# Patient Record
Sex: Male | Born: 1960
Health system: Southern US, Community
[De-identification: ages and names within clinical notes are randomized; demographics above are authoritative.]

## PROBLEM LIST (undated history)

## (undated) DIAGNOSIS — I2510 Atherosclerotic heart disease of native coronary artery without angina pectoris: Secondary | ICD-10-CM

## (undated) DIAGNOSIS — K589 Irritable bowel syndrome without diarrhea: Secondary | ICD-10-CM

---

## 2000-01-13 ENCOUNTER — Ambulatory Visit (HOSPITAL_COMMUNITY): Admission: RE | Admit: 2000-01-13 | Discharge: 2000-01-13 | Payer: Self-pay | Admitting: Urology

## 2000-01-13 ENCOUNTER — Encounter: Payer: Self-pay | Admitting: Urology

## 2016-08-02 DIAGNOSIS — Z1211 Encounter for screening for malignant neoplasm of colon: Secondary | ICD-10-CM | POA: Diagnosis not present

## 2016-08-02 DIAGNOSIS — Z125 Encounter for screening for malignant neoplasm of prostate: Secondary | ICD-10-CM | POA: Diagnosis not present

## 2016-08-02 DIAGNOSIS — Z0000 Encounter for general adult medical examination without abnormal findings: Secondary | ICD-10-CM | POA: Diagnosis not present

## 2016-08-02 DIAGNOSIS — E7800 Pure hypercholesterolemia, unspecified: Secondary | ICD-10-CM | POA: Diagnosis not present

## 2016-11-30 DIAGNOSIS — D489 Neoplasm of uncertain behavior, unspecified: Secondary | ICD-10-CM | POA: Diagnosis not present

## 2017-01-23 DIAGNOSIS — R109 Unspecified abdominal pain: Secondary | ICD-10-CM | POA: Diagnosis not present

## 2017-01-23 DIAGNOSIS — Z1211 Encounter for screening for malignant neoplasm of colon: Secondary | ICD-10-CM | POA: Diagnosis not present

## 2017-02-16 DIAGNOSIS — Z1211 Encounter for screening for malignant neoplasm of colon: Secondary | ICD-10-CM | POA: Diagnosis not present

## 2017-04-14 DIAGNOSIS — K5730 Diverticulosis of large intestine without perforation or abscess without bleeding: Secondary | ICD-10-CM | POA: Diagnosis not present

## 2017-04-14 DIAGNOSIS — Z1211 Encounter for screening for malignant neoplasm of colon: Secondary | ICD-10-CM | POA: Diagnosis not present

## 2017-04-14 DIAGNOSIS — K635 Polyp of colon: Secondary | ICD-10-CM | POA: Diagnosis not present

## 2018-12-26 ENCOUNTER — Emergency Department (HOSPITAL_COMMUNITY)
Admission: EM | Admit: 2018-12-26 | Discharge: 2018-12-26 | Disposition: A | Discharge status: Home / Self Care | Payer: Commercial Managed Care - PPO | Attending: Emergency Medicine | Admitting: Emergency Medicine

## 2018-12-26 ENCOUNTER — Emergency Department (HOSPITAL_COMMUNITY): Payer: Commercial Managed Care - PPO

## 2018-12-26 ENCOUNTER — Other Ambulatory Visit: Payer: Self-pay

## 2018-12-26 ENCOUNTER — Encounter (HOSPITAL_COMMUNITY): Payer: Self-pay | Admitting: Emergency Medicine

## 2018-12-26 DIAGNOSIS — N50812 Left testicular pain: Secondary | ICD-10-CM | POA: Diagnosis present

## 2018-12-26 DIAGNOSIS — R1032 Left lower quadrant pain: Secondary | ICD-10-CM | POA: Diagnosis not present

## 2018-12-26 DIAGNOSIS — R112 Nausea with vomiting, unspecified: Secondary | ICD-10-CM | POA: Insufficient documentation

## 2018-12-26 DIAGNOSIS — R8290 Unspecified abnormal findings in urine: Secondary | ICD-10-CM | POA: Insufficient documentation

## 2018-12-26 DIAGNOSIS — N23 Unspecified renal colic: Secondary | ICD-10-CM

## 2018-12-26 HISTORY — DX: Irritable bowel syndrome, unspecified: K58.9

## 2018-12-26 LAB — URINALYSIS, ROUTINE W REFLEX MICROSCOPIC
Bilirubin Urine: NEGATIVE
Glucose, UA: NEGATIVE mg/dL
Ketones, ur: NEGATIVE mg/dL
Leukocytes,Ua: NEGATIVE
Nitrite: NEGATIVE
Protein, ur: NEGATIVE mg/dL
RBC / HPF: >50 RBC/hpf — ABNORMAL HIGH (ref 0–5)
Specific Gravity, Urine: 1.004 — ABNORMAL LOW (ref 1.005–1.030)
pH: 6 (ref 5.0–8.0)

## 2018-12-26 LAB — COMPREHENSIVE METABOLIC PANEL
ALT: 45 U/L — ABNORMAL HIGH (ref 0–44)
AST: 40 U/L (ref 15–41)
Albumin: 4.2 g/dL (ref 3.5–5.0)
Alkaline Phosphatase: 84 U/L (ref 38–126)
Anion gap: 8 (ref 5–15)
BUN: 11 mg/dL (ref 6–20)
CO2: 23 mmol/L (ref 22–32)
Calcium: 9.2 mg/dL (ref 8.9–10.3)
Chloride: 106 mmol/L (ref 98–111)
Creatinine, Ser: 0.88 mg/dL (ref 0.61–1.24)
GFR calc Af Amer: >60 mL/min (ref >60)
GFR calc non Af Amer: >60 mL/min (ref >60)
Glucose, Bld: 101 mg/dL — ABNORMAL HIGH (ref 70–99)
Potassium: 4.1 mmol/L (ref 3.5–5.1)
Sodium: 137 mmol/L (ref 135–145)
Total Bilirubin: 0.8 mg/dL (ref 0.3–1.2)
Total Protein: 7.8 g/dL (ref 6.5–8.1)

## 2018-12-26 LAB — CBC WITH DIFFERENTIAL/PLATELET
Abs Immature Granulocytes: 0.03 10*3/uL (ref 0.00–0.07)
Basophils Absolute: 0 10*3/uL (ref 0.0–0.1)
Basophils Relative: 0 %
Eosinophils Absolute: 0 10*3/uL (ref 0.0–0.5)
Eosinophils Relative: 0 %
HCT: 43.3 % (ref 39.0–52.0)
Hemoglobin: 14.9 g/dL (ref 13.0–17.0)
Immature Granulocytes: 0 %
Lymphocytes Relative: 10 %
Lymphs Abs: 1.1 10*3/uL (ref 0.7–4.0)
MCH: 32.9 pg (ref 26.0–34.0)
MCHC: 34.4 g/dL (ref 30.0–36.0)
MCV: 95.6 fL (ref 80.0–100.0)
Monocytes Absolute: 0.5 10*3/uL (ref 0.1–1.0)
Monocytes Relative: 4 %
Neutro Abs: 9.9 10*3/uL — ABNORMAL HIGH (ref 1.7–7.7)
Neutrophils Relative %: 86 %
Platelets: 168 10*3/uL (ref 150–400)
RBC: 4.53 MIL/uL (ref 4.22–5.81)
RDW: 12.8 % (ref 11.5–15.5)
WBC: 11.6 10*3/uL — ABNORMAL HIGH (ref 4.0–10.5)
nRBC: 0 % (ref 0.0–0.2)

## 2018-12-26 LAB — LIPASE, BLOOD: Lipase: 25 U/L (ref 11–51)

## 2018-12-26 MED ORDER — HYDROCODONE-ACETAMINOPHEN 5-325 MG PO TABS: 2.0000 | ORAL_TABLET | ORAL | 0 refills | Status: DC | PRN

## 2018-12-26 MED ORDER — KETOROLAC TROMETHAMINE 30 MG/ML IJ SOLN
30.0000 mg | Freq: Once | INTRAMUSCULAR | Status: AC
  Administered 2018-12-26: 30 mg via INTRAVENOUS
  Filled 2018-12-26: qty 1

## 2018-12-26 MED ORDER — IBUPROFEN 600 MG PO TABS: 600.0000 mg | ORAL_TABLET | Freq: Four times a day (QID) | ORAL | 0 refills | Status: DC | PRN

## 2018-12-26 MED ORDER — TAMSULOSIN HCL 0.4 MG PO CAPS: 0.4000 mg | ORAL_CAPSULE | Freq: Every day | ORAL | 0 refills | Status: DC

## 2018-12-26 NOTE — ED Triage Notes (Signed)
Per EMS, patient from home, c/o testicle pain radiating to LLQ x3 days worsening this morning. One reported episode of N/V this morning. States swollen left testicle.  18g L AC 544ml NS with EMS BP 144/98 RR 16 HR 90 O2 98% RA

## 2018-12-26 NOTE — ED Provider Notes (Signed)
[facility] DEPT Provider Note   CSN: [id number] Arrival date & time: 12/26/18  1138    History   Chief Complaint Chief Complaint  Patient presents with  . Groin Swelling    HPI Larry Ashley is a 58 y.o. male.     HPI Patient presents with 2 days of left testicular pain which radiates to his left lower abdomen.  States the pain is episodic and sharp.  Had a episode of pain this morning associated with nausea and several bouts of vomiting.  He denies any flank or back pain.  States currently the pain has significantly improved.  States that yesterday he did notice that his urine was much darker in color and he had the sensation that he needed to urinate and was not able to.  Denies any penile discharge.  Thinks the left testicle may be more swollen than the right.  Denies any known trauma.  Patient is sexually active only with his wife.  No diarrhea or constipation.  No blood in the stool. Past Medical History:  Diagnosis Date  . IBS (irritable bowel syndrome)     There are no active problems to display for this patient.   History reviewed. No pertinent surgical history.      Home Medications    Prior to Admission medications   Medication Sig Start Date End Date Taking? Authorizing Provider  HYDROcodone-acetaminophen (NORCO) 5-325 MG tablet Take 2 tablets by mouth every 4 (four) hours as needed. 12/26/18   Julianne Rice, MD  ibuprofen (ADVIL) 600 MG tablet Take 1 tablet (600 mg total) by mouth every 6 (six) hours as needed for moderate pain. 12/26/18   Julianne Rice, MD  tamsulosin (FLOMAX) 0.4 MG CAPS capsule Take 1 capsule (0.4 mg total) by mouth daily. 12/26/18   Julianne Rice, MD    Family History No family history on file.  Social History Social History   Tobacco Use  . Smoking status: Not on file  Substance Use Topics  . Alcohol use: Not on file  . Drug use: Not on file     Allergies   Patient has no known allergies.    Review of Systems Review of Systems  Constitutional: Negative for chills, fatigue and fever.  Respiratory: Negative for cough and shortness of breath.   Cardiovascular: Negative for chest pain.  Gastrointestinal: Positive for abdominal pain, nausea and vomiting. Negative for constipation and diarrhea.  Genitourinary: Positive for difficulty urinating, scrotal swelling and testicular pain. Negative for discharge, dysuria, flank pain, frequency and penile pain.  Musculoskeletal: Negative for back pain, myalgias and neck pain.  Skin: Negative for rash and wound.  Neurological: Negative for dizziness, weakness, light-headedness, numbness and headaches.  All other systems reviewed and are negative.    Physical Exam Updated Vital Signs BP 120/85   Pulse 83   Temp 97.7 F (36.5 C) (Oral)   Resp 17   SpO2 99%   Physical Exam Vitals signs and nursing note reviewed.  Constitutional:      General: He is not in acute distress.    Appearance: Normal appearance. He is well-developed. He is not ill-appearing.  HENT:     Head: Normocephalic and atraumatic.     Nose: Nose normal.     Mouth/Throat:     Mouth: Mucous membranes are moist.  Eyes:     Pupils: Pupils are equal, round, and reactive to light.  Neck:     Musculoskeletal: Normal range of motion and neck supple.  Cardiovascular:     Rate and Rhythm: Normal rate and regular rhythm.     Heart sounds: No murmur. No friction rub. No gallop.   Pulmonary:     Effort: Pulmonary effort is normal. No respiratory distress.     Breath sounds: Normal breath sounds. No stridor. No wheezing, rhonchi or rales.  Chest:     Chest wall: No tenderness.  Abdominal:     General: Bowel sounds are normal. There is no distension.     Palpations: Abdomen is soft. There is no mass.     Tenderness: There is no abdominal tenderness. There is no right CVA tenderness, left CVA tenderness, guarding or rebound.     Hernia: No hernia is present.   Genitourinary:    Penis: Normal.      Comments: Circumcised penis.  No penile discharge.  Cremasteric reflexes bilaterally intact.  No appreciated masses or hernias.  Mild left testicular tenderness to palpation without obvious swelling.  Testicles appear to be in a normal lie. Musculoskeletal: Normal range of motion.        General: No tenderness.  Skin:    General: Skin is warm and dry.     Capillary Refill: Capillary refill takes less than 2 seconds.     Findings: No erythema or rash.  Neurological:     Mental Status: He is alert and oriented to person, place, and time.  Psychiatric:        Mood and Affect: Mood normal.        Behavior: Behavior normal.      ED Treatments / Results  Labs (all labs ordered are listed, but only abnormal results are displayed) Labs Reviewed  CBC WITH DIFFERENTIAL/PLATELET - Abnormal; Notable for the following components:      Result Value   WBC 11.6 (*)    Neutro Abs 9.9 (*)    All other components within normal limits  COMPREHENSIVE METABOLIC PANEL - Abnormal; Notable for the following components:   Glucose, Bld 101 (*)    ALT 45 (*)    All other components within normal limits  URINALYSIS, ROUTINE W REFLEX MICROSCOPIC - Abnormal; Notable for the following components:   APPearance HAZY (*)    Specific Gravity, Urine 1.004 (*)    Hgb urine dipstick LARGE (*)    RBC / HPF >50 (*)    Bacteria, UA FEW (*)    All other components within normal limits  LIPASE, BLOOD    EKG None  Radiology Ct Renal Stone Study  Result Date: 12/26/2018 CLINICAL DATA:  Left lower quadrant/flank pain with nausea and vomiting EXAM: CT ABDOMEN AND PELVIS WITHOUT CONTRAST TECHNIQUE: Multidetector CT imaging of the abdomen and pelvis was performed following the standard protocol without oral or IV contrast. COMPARISON:  None. FINDINGS: Lower chest: There are foci of bibasilar atelectasis. On axial slice 10 series 6, there is a 3 mm nodular opacity in the right  middle lobe. There are foci of coronary artery calcification. Hepatobiliary: No focal liver lesions are evident on this noncontrast enhanced study. The gallbladder wall is not appreciably thickened. There is no biliary duct dilatation. Pancreas: There is no pancreatic mass or inflammatory focus. Spleen: No splenic lesions are evident. Adrenals/Urinary Tract: Adrenals bilaterally appear unremarkable. Left kidney appears subtly edematous. There is no evident renal mass on either side. There is mild hydronephrosis on the left. There is no appreciable hydronephrosis on the right. There is no intrarenal calculus on either side. There is a 3 x  2 mm calculus at the left ureterovesical junction. No other ureteral calculi are evident. Urinary bladder is midline. There is slight thickening along the posterior wall of the urinary bladder. The remainder of the urinary bladder wall appears unremarkable. Stomach/Bowel: There are scattered colonic diverticula, most notably in the sigmoid region. No diverticulitis. No appreciable bowel wall thickening. No bowel obstruction. Terminal ileum appears unremarkable. There is no evident free air or portal venous air. Vascular/Lymphatic: There is aortic atherosclerosis. There are foci of calcification in each common iliac artery as well. No aneurysm evident. There is no adenopathy in the abdomen or pelvis. Reproductive: Prostate and seminal vesicles are normal in size and contour. No evident pelvic mass. Small calcifications are noted in the upper left scrotal sac. Other: Appendix appears normal. No abscess or ascites is evident in the abdomen or pelvis. Musculoskeletal: There are no blastic or lytic bone lesions. There is no intramuscular or abdominal wall lesions. IMPRESSION: 1. 3 x 2 mm calculus at the left ureterovesical junction with mild hydronephrosis on the left. Left kidney is subtly edematous. 2. Slight thickening of the wall of the urinary bladder posteriorly. Question localized  inflammation. A well-defined mass is not seen in this area. This localized thickening may warrant urologic consultation with direct visualization, however. 3. Small calcifications in the upper left scrotum of uncertain etiology. Benign etiology suspected. 4. Scattered colonic diverticula without diverticulitis. No bowel obstruction. No abscess in the abdomen pelvis. Appendix appears normal. 5. 3 mm nodular opacity in right middle lobe. No follow-up needed if patient is low-risk. Non-contrast chest CT can be considered in 12 months if patient is high-risk. This recommendation follows the consensus statement: Guidelines for Management of Incidental Pulmonary Nodules Detected on CT Images: From the Fleischner Society 2017; Radiology 2017; 284:228-243. 6. Scattered foci of arterial vascular calcification including foci of coronary artery calcification. Electronically Signed   By: Lowella Grip III M.D.   On: 12/26/2018 13:55    Procedures Procedures (including critical care time)  Medications Ordered in ED Medications  ketorolac (TORADOL) 30 MG/ML injection 30 mg (30 mg Intravenous Given 12/26/18 1417)     Initial Impression / Assessment and Plan / ED Course  I have reviewed the triage vital signs and the nursing notes.  Pertinent labs & imaging results that were available during my care of the patient were reviewed by me and considered in my medical decision making (see chart for details).        Patient is comfortable appearing.  CT renal stone with evidence of ureteral stone on the left moderate hydronephrosis.  No evidence of infectious process.  Will treat symptomatically and have follow-up with urology.  Strict return precautions have been given.  Final Clinical Impressions(s) / ED Diagnoses   Final diagnoses:  Ureteral colic    ED Discharge Orders         Ordered    ibuprofen (ADVIL) 600 MG tablet  Every 6 hours PRN     12/26/18 1424    HYDROcodone-acetaminophen (NORCO) 5-325  MG tablet  Every 4 hours PRN     12/26/18 1424    tamsulosin (FLOMAX) 0.4 MG CAPS capsule  Daily     12/26/18 1424           Julianne Rice, MD 12/26/18 1425

## 2020-11-17 ENCOUNTER — Emergency Department (HOSPITAL_COMMUNITY): Payer: Commercial Managed Care - PPO

## 2020-11-17 ENCOUNTER — Emergency Department (HOSPITAL_COMMUNITY)
Admission: EM | Admit: 2020-11-17 | Discharge: 2020-11-17 | Disposition: A | Discharge status: Home / Self Care | Payer: Commercial Managed Care - PPO | Attending: Emergency Medicine | Admitting: Emergency Medicine

## 2020-11-17 ENCOUNTER — Other Ambulatory Visit: Payer: Self-pay

## 2020-11-17 ENCOUNTER — Encounter (HOSPITAL_COMMUNITY): Payer: Self-pay | Admitting: Emergency Medicine

## 2020-11-17 DIAGNOSIS — F17210 Nicotine dependence, cigarettes, uncomplicated: Secondary | ICD-10-CM | POA: Diagnosis not present

## 2020-11-17 DIAGNOSIS — R0602 Shortness of breath: Secondary | ICD-10-CM | POA: Insufficient documentation

## 2020-11-17 DIAGNOSIS — R0789 Other chest pain: Secondary | ICD-10-CM | POA: Diagnosis not present

## 2020-11-17 LAB — BASIC METABOLIC PANEL
Anion gap: 7 (ref 5–15)
BUN: 7 mg/dL (ref 6–20)
CO2: 23 mmol/L (ref 22–32)
Calcium: 9.2 mg/dL (ref 8.9–10.3)
Chloride: 107 mmol/L (ref 98–111)
Creatinine, Ser: 0.95 mg/dL (ref 0.61–1.24)
GFR, Estimated: >60 mL/min (ref >60)
Glucose, Bld: 171 mg/dL — ABNORMAL HIGH (ref 70–99)
Potassium: 3.5 mmol/L (ref 3.5–5.1)
Sodium: 137 mmol/L (ref 135–145)

## 2020-11-17 LAB — CBC
HCT: 44.8 % (ref 39.0–52.0)
Hemoglobin: 15.7 g/dL (ref 13.0–17.0)
MCH: 33.3 pg (ref 26.0–34.0)
MCHC: 35 g/dL (ref 30.0–36.0)
MCV: 95.1 fL (ref 80.0–100.0)
Platelets: 178 10*3/uL (ref 150–400)
RBC: 4.71 MIL/uL (ref 4.22–5.81)
RDW: 13.2 % (ref 11.5–15.5)
WBC: 7.1 10*3/uL (ref 4.0–10.5)
nRBC: 0 % (ref 0.0–0.2)

## 2020-11-17 LAB — D-DIMER, QUANTITATIVE: D-Dimer, Quant: 0.33 ug/mL-FEU (ref 0.00–0.50)

## 2020-11-17 LAB — TROPONIN I (HIGH SENSITIVITY)
Troponin I (High Sensitivity): 4 ng/L (ref <18)
Troponin I (High Sensitivity): 4 ng/L (ref <18)

## 2020-11-17 MED ORDER — ASPIRIN 81 MG PO CHEW
324.0000 mg | CHEWABLE_TABLET | Freq: Once | ORAL | Status: AC
  Administered 2020-11-17: 324 mg via ORAL
  Filled 2020-11-17: qty 4

## 2020-11-17 NOTE — ED Provider Notes (Signed)
Emergency Medicine Provider Triage Evaluation Note  Larry Ashley , a 60 y.o. male  was evaluated in triage.  Pt complains of chest pain and tightness onset at 0430 which has been constant. Radiates to L shoulder and associated with SOB. No medications taken PTA and no prior hx of ACS. Hx of CAD and ACS in father at the age of 61.  Review of Systems  Positive: Chest pain, SOB Negative: syncope  Physical Exam  There were no vitals taken for this visit. Gen:   Awake, alert Resp:  Dyspneic without tachypnea. Chest expansion symmetric. MSK:   Moves extremities without difficulty  Other:  Anxious appearing  Medical Decision Making  Medically screening exam initiated at 6:33 AM.  Appropriate orders placed.  VIRAAJ VORNDRAN was informed that the remainder of the evaluation will be completed by another provider, this initial triage assessment does not replace that evaluation, and the importance of remaining in the ED until their evaluation is complete.  Chest pain   Antonietta Breach, PA-C 11/17/20 1275    Fredia Sorrow, MD 11/17/20 1427

## 2020-11-17 NOTE — ED Provider Notes (Signed)
[facility] EMERGENCY DEPARTMENT Provider Note   CSN: [id number] Arrival date & time: 11/17/20  0539     History Chief Complaint  Patient presents with   Chest Pain    Larry Ashley is a 60 y.o. male.  Patient awoke this morning at 430 with some anterior chest tightness that has persisted and not feeling quite right and shortness of breath.  Patient states this is happened before sometimes lasting a day sometimes just hours but not has happened recently.  And this alarmed him more.  No nausea no vomiting.  No leg swelling.  Patient has not followed by cardiology.  No known cardiac history.      Past Medical History:  Diagnosis Date   IBS (irritable bowel syndrome)     There are no problems to display for this patient.   History reviewed. No pertinent surgical history.     No family history on file.  Social History   Tobacco Use   Smoking status: Every Day    Packs/day: 1.00    Years: 49.00    Pack years: 49.00    Types: Cigarettes   Smokeless tobacco: Never    Home Medications Prior to Admission medications   Medication Sig Start Date End Date Taking? Authorizing Provider  HYDROcodone-acetaminophen (NORCO) 5-325 MG tablet Take 2 tablets by mouth every 4 (four) hours as needed. 12/26/18   Julianne Rice, MD  ibuprofen (ADVIL) 600 MG tablet Take 1 tablet (600 mg total) by mouth every 6 (six) hours as needed for moderate pain. 12/26/18   Julianne Rice, MD  tamsulosin (FLOMAX) 0.4 MG CAPS capsule Take 1 capsule (0.4 mg total) by mouth daily. 12/26/18   Julianne Rice, MD    Allergies    Patient has no known allergies.  Review of Systems   Review of Systems  Constitutional:  Negative for chills and fever.  HENT:  Negative for ear pain and sore throat.   Eyes:  Negative for pain and visual disturbance.  Respiratory:  Positive for shortness of breath. Negative for cough.   Cardiovascular:  Positive for chest pain. Negative for  palpitations.  Gastrointestinal:  Negative for abdominal pain and vomiting.  Genitourinary:  Negative for dysuria and hematuria.  Musculoskeletal:  Negative for arthralgias and back pain.  Skin:  Negative for color change and rash.  Neurological:  Negative for seizures and syncope.  All other systems reviewed and are negative.  Physical Exam Updated Vital Signs BP (!) 149/91   Pulse 65   Temp 97.6 F (36.4 C) (Oral)   Resp 10   SpO2 98%   Physical Exam Vitals and nursing note reviewed.  Constitutional:      General: He is not in acute distress.    Appearance: He is well-developed.  HENT:     Head: Normocephalic and atraumatic.  Eyes:     Conjunctiva/sclera: Conjunctivae normal.  Cardiovascular:     Rate and Rhythm: Normal rate and regular rhythm.     Heart sounds: No murmur heard. Pulmonary:     Effort: Pulmonary effort is normal. No respiratory distress.     Breath sounds: Normal breath sounds.  Chest:     Chest wall: No tenderness.  Abdominal:     Palpations: Abdomen is soft.     Tenderness: There is no abdominal tenderness.  Musculoskeletal:        General: No swelling.     Cervical back: Neck supple.  Skin:    General: Skin is warm  and dry.  Neurological:     General: No focal deficit present.     Mental Status: He is alert and oriented to person, place, and time.    ED Results / Procedures / Treatments   Labs (all labs ordered are listed, but only abnormal results are displayed) Labs Reviewed  BASIC METABOLIC PANEL - Abnormal; Notable for the following components:      Result Value   Glucose, Bld 171 (*)    All other components within normal limits  CBC  D-DIMER, QUANTITATIVE  TROPONIN I (HIGH SENSITIVITY)  TROPONIN I (HIGH SENSITIVITY)    EKG EKG Interpretation  Date/Time:  Tuesday November 17 2020 06:44:02 EDT Ventricular Rate:  83 PR Interval:  132 QRS Duration: 84 QT Interval:  346 QTC Calculation: 406 R Axis:   54 Text  Interpretation: Normal sinus rhythm Low voltage QRS Cannot rule out Anterior infarct , age undetermined Abnormal ECG Confirmed by Fredia Sorrow [phone]) on 11/17/2020 2:26:51 PM  Radiology DG Chest 2 View  Result Date: 11/17/2020 CLINICAL DATA:  60 year old male with shortness of breath and chest pain. Left shoulder pain. Smoker. EXAM: CHEST - 2 VIEW COMPARISON:  CT Abdomen and Pelvis 12/26/2018. FINDINGS: Lung volumes and mediastinal contours are within normal limits. Bilateral increased pulmonary interstitial markings, similar to those at the lung bases in 2020. Probable bilateral nipple shadows. EKG button artifact. No pneumothorax, pulmonary edema, pleural effusion or confluent pulmonary opacity. Visualized tracheal air column is within normal limits. No acute osseous abnormality identified. Negative visible bowel gas pattern. IMPRESSION: Nipple shadows and probable smoking related increased pulmonary interstitial markings. No definite acute cardiopulmonary abnormality. Electronically Signed   By: Genevie Ann M.D.   On: 11/17/2020 07:17    Procedures Procedures   Medications Ordered in ED Medications  aspirin chewable tablet 324 mg (324 mg Oral Given 11/17/20 1337)    ED Course  I have reviewed the triage vital signs and the nursing notes.  Pertinent labs & imaging results that were available during my care of the patient were reviewed by me and considered in my medical decision making (see chart for details).    MDM Rules/Calculators/A&P                         Patient's chest pain is concerning for possible acute cardiac event.  EKG without any acute changes except for cannot rule out anterior infarct but nothing that looks acute.  Chest x-ray without any significant findings.  Troponins are negative x2 essentially ruling out an acute cardiac event.  However there is some concern because of the shortness of breath possible pulmonary embolus.  But that would not fit into his past history of  similar pain but this was worse.  Will get D-dimer.  If D-dimer is elevated patient will get CT scan chest.  If it is not then patient can be discharged home.  We will have patient follow-up with cardiology.  Final Clinical Impression(s) / ED Diagnoses Final diagnoses:  Atypical chest pain  SOB (shortness of breath)    Rx / DC Orders ED Discharge Orders     None        Fredia Sorrow, MD 11/17/20 1530

## 2020-11-17 NOTE — Discharge Instructions (Signed)
Work-up for the chest pain negative here today.  Make an appointment to follow-up with cardiology.  Turn for any new or worse symptoms

## 2020-11-17 NOTE — ED Triage Notes (Signed)
Patient states that he is unable to catch his breath and has a "weird" feeling in his chest.  Patient states that he is having some left shoulder pain with this feeling.  Patient denies any nausea or vomiting.  Patient does have anxiety.

## 2020-12-02 ENCOUNTER — Encounter: Payer: Self-pay | Admitting: Cardiovascular Disease

## 2020-12-02 ENCOUNTER — Other Ambulatory Visit: Payer: Self-pay

## 2020-12-02 ENCOUNTER — Ambulatory Visit: Payer: Commercial Managed Care - PPO | Admitting: Cardiovascular Disease

## 2020-12-02 VITALS — BP 134/78 | HR 76 | Ht 62.0 in | Wt 146.8 lb

## 2020-12-02 DIAGNOSIS — R0602 Shortness of breath: Secondary | ICD-10-CM

## 2020-12-02 DIAGNOSIS — E7800 Pure hypercholesterolemia, unspecified: Secondary | ICD-10-CM | POA: Diagnosis not present

## 2020-12-02 DIAGNOSIS — R079 Chest pain, unspecified: Secondary | ICD-10-CM

## 2020-12-02 DIAGNOSIS — R072 Precordial pain: Secondary | ICD-10-CM | POA: Diagnosis not present

## 2020-12-02 DIAGNOSIS — Z8249 Family history of ischemic heart disease and other diseases of the circulatory system: Secondary | ICD-10-CM

## 2020-12-02 DIAGNOSIS — Z720 Tobacco use: Secondary | ICD-10-CM | POA: Insufficient documentation

## 2020-12-02 MED ORDER — METOPROLOL TARTRATE 100 MG PO TABS: ORAL_TABLET | ORAL | 0 refills | Status: DC

## 2020-12-02 NOTE — Progress Notes (Signed)
12/02/2020 Larry Ashley   10-26-1960  RN:[id number]  Primary Physician Larry Ashley, L.Larry Sa, MD Primary Cardiologist: Larry Harp MD Larry Ashley, [place]  HPI:  Larry Ashley is a 60 y.o. 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 He was referred by the emergency room because of chest pain.  His risk factors include 75 pack years of tobacco abuse, strong family history of heart disease with both mother and father who have had CAD in both who are patients of mine.  He is never had a heart attack or stroke.  He is developed chest pain over the last 6 to 8 months occurring once or twice a week lasting up to 30 minutes at a time.  He was recently seen in the ER 11/17/2020 with chest pain.  He ruled out for myocardial infarction.  His EKG showed no acute changes.  No outpatient medications have been marked as taking for the 12/02/20 encounter (Office Visit) with Larry Harp, MD.     No Known Allergies  Social History   Socioeconomic History   Marital status: Married    Spouse name: Not on file   Number of children: Not on file   Years of education: Not on file   Highest education level: Not on file  Occupational History   Not on file  Tobacco Use   Smoking status: Every Day    Packs/day: 1.00    Years: 49.00    Pack years: 49.00    Types: Cigarettes   Smokeless tobacco: Never  Substance and Sexual Activity   Alcohol use: Not on file   Drug use: Not on file   Sexual activity: Not on file  Other Topics Concern   Not on file  Social History Narrative   Not on file   Social Determinants of Health   Financial Resource Strain: Not on file  Food Insecurity: Not on file  Transportation Needs: Not on file  Physical Activity: Not on file  Stress: Not on file  Social Connections: Not on file  Intimate Partner Violence: Not on file      Review of Systems: General: negative for chills, fever, night sweats or weight changes.  Cardiovascular: negative for chest pain, dyspnea on exertion, edema, orthopnea, palpitations, paroxysmal nocturnal dyspnea or shortness of breath Dermatological: negative for rash Respiratory: negative for cough or wheezing Urologic: negative for hematuria Abdominal: negative for nausea, vomiting, diarrhea, bright red blood per rectum, melena, or hematemesis Neurologic: negative for visual changes, syncope, or dizziness All other systems reviewed and are otherwise negative except as noted above.    Blood pressure 134/78, pulse 76, height '5\' 2"'$  (1.575 m), weight 146 lb 12.8 oz (66.6 kg), SpO2 99 %.  General appearance: alert and no distress Neck: no adenopathy, no carotid bruit, no JVD, supple, symmetrical, trachea midline, and thyroid not enlarged, symmetric, no tenderness/mass/nodules Lungs: clear to auscultation bilaterally Heart: regular rate and rhythm, S1, S2 normal, no murmur, click, rub or gallop Extremities: extremities normal, atraumatic, no cyanosis or edema Pulses: 2+ and symmetric Skin: Skin color, texture, turgor normal. No rashes or lesions Neurologic: Grossly normal  EKG not performed today  ASSESSMENT AND PLAN:   Tobacco abuse Smokes 1-1/2 packs a day for last 50 years (75 pack years) recalcitrant risk factor modification.  Family history of heart disease Both mother (Larry Ashley) and  father Larry Ashley ) are both patients of mine and both have CAD.)  Chest pain of uncertain etiology [id number] history of chest pain lasting up to 30 minutes at a time with radiation to his neck occurring once or twice a week.  He was recently seen in the emergency room 11/17/2020 with chest pain.  His enzymes were negative.  EKG shows no acute changes.  He was awakened from sleep with chest pain when he self went to the emergency room.     Larry Harp MD FACP,FACC,FAHA, [facility] 12/02/2020 9:10  AM

## 2020-12-02 NOTE — Assessment & Plan Note (Signed)
6-48-monthhistory of chest pain lasting up to 30 minutes at a time with radiation to his neck occurring once or twice a week.  He was recently seen in the emergency room 11/17/2020 with chest pain.  His enzymes were negative.  EKG shows no acute changes.  He was awakened from sleep with chest pain when he self went to the emergency room.

## 2020-12-02 NOTE — Patient Instructions (Signed)
Lab Work:  Your physician recommends that you return for lab work FASTING  If you have labs (blood work) drawn today and your tests are completely normal, you will receive your results only by: [place] (if you have [place]) OR A paper copy in the mail If you have any lab test that is abnormal or we need to change your treatment, we will call you to review the results.   Testing/Procedures:  Your physician has requested that you have an echocardiogram. Echocardiography is a painless test that uses sound waves to create images of your heart. It provides your doctor with information about the size and shape of your heart and how well your heart's chambers and valves are working. This procedure takes approximately one hour. There are no restrictions for this procedure. [place]    Your cardiac CT will be scheduled at one of the below locations:   [facility] [address] [place],  [address] [phone]   If scheduled at [facility], please arrive at the [facility] main entrance (entrance A) of [facility] 30 minutes prior to test start time. Proceed to the [facility] Radiology Department (first floor) to check-in and test prep.   Please follow these instructions carefully (unless otherwise directed):  Hold all erectile dysfunction medications at least 3 days (72 hrs) prior to test.  On the Night Before the Test: Be sure to Drink plenty of water. Do not consume any caffeinated/decaffeinated beverages or chocolate 12 hours prior to your test. Do not take any antihistamines 12 hours prior to your test.  On the Day of the Test: Drink plenty of water until 1 hour prior to the test. Do not eat any food 4 hours prior to the test. You may take your regular medications prior to the test.  Take metoprolol (Lopressor) 100 MG two hours prior to test. HOLD Furosemide/Hydrochlorothiazide morning of the test.   After the  Test: Drink plenty of water. After receiving IV contrast, you may experience a mild flushed feeling. This is normal. On occasion, you may experience a mild rash up to 24 hours after the test. This is not dangerous. If this occurs, you can take Benadryl 25 mg and increase your fluid intake. If you experience trouble breathing, this can be serious. If it is severe call 911 IMMEDIATELY. If it is mild, please call our office. If you take any of these medications: Glipizide/Metformin, Avandament, Glucavance, please do not take 48 hours after completing test unless otherwise instructed.  Please allow 2-4 weeks for scheduling of routine cardiac CTs. Some insurance companies require a pre-authorization which may delay scheduling of this test.   For non-scheduling related questions, please contact the cardiac imaging nurse navigator should you have any questions/concerns: Marchia Bond, Cardiac Imaging Nurse Navigator Gordy Clement, Cardiac Imaging Nurse Navigator [facility] Direct Office Dial: [phone]   For scheduling needs, including cancellations and rescheduling, please call [place], [phone].    Follow-Up: At [facility], you and your health needs are our priority.  As part of our continuing mission to provide you with exceptional heart care, we have created designated Provider Care Teams.  These Care Teams include your primary Cardiologist (physician) and Advanced Practice Providers (APPs -  Physician Assistants and Nurse Practitioners) who all work together to provide you with the care you need, when you need it.  We recommend signing up for the patient portal called "MyChart".  Sign up  information is provided on this After Visit Summary.  MyChart is used to connect with patients for Virtual Visits (Telemedicine).  Patients are able to view lab/test results, encounter notes, upcoming appointments, etc.  Non-urgent messages can be sent to your provider as  well.   To learn more about what you can do with MyChart, go to [email or web address].    Your next appointment:   3 week(s)  The format for your next appointment:   In Person  Provider:   Quay Burow, MD

## 2020-12-02 NOTE — Assessment & Plan Note (Signed)
Smokes 1-1/2 packs a day for last 50 years (75 pack years) recalcitrant risk factor modification.

## 2020-12-02 NOTE — Assessment & Plan Note (Signed)
Both mother Larry Ashley) and father Larry Ashley ) are both patients of mine and both have CAD.)

## 2020-12-04 ENCOUNTER — Telehealth (HOSPITAL_COMMUNITY): Payer: Self-pay | Admitting: Emergency Medicine

## 2020-12-04 NOTE — Telephone Encounter (Signed)
Reaching out to patient to offer assistance regarding upcoming cardiac imaging study; pt verbalizes understanding of appt date/time, parking situation and where to check in, pre-test NPO status and medications ordered, and verified current allergies; name and call back number provided for further questions should they arise Larry Bond RN [place] [facility] [phone] office [phone] cell   Denies claustro Denies iv issues '100mg'$  metoprolol tartrate

## 2020-12-08 LAB — LIPID PANEL
Chol/HDL Ratio: 6 ratio — ABNORMAL HIGH (ref 0.0–5.0)
Cholesterol, Total: 222 mg/dL — ABNORMAL HIGH (ref 100–199)
HDL: 37 mg/dL — ABNORMAL LOW (ref >39)
LDL Chol Calc (NIH): 132 mg/dL — ABNORMAL HIGH (ref 0–99)
Triglycerides: 296 mg/dL — ABNORMAL HIGH (ref 0–149)
VLDL Cholesterol Cal: 53 mg/dL — ABNORMAL HIGH (ref 5–40)

## 2020-12-08 LAB — HEPATIC FUNCTION PANEL
ALT: 36 IU/L (ref 0–44)
AST: 36 IU/L (ref 0–40)
Albumin: 4.9 g/dL (ref 3.8–4.9)
Alkaline Phosphatase: 112 IU/L (ref 44–121)
Bilirubin Total: 0.6 mg/dL (ref 0.0–1.2)
Bilirubin, Direct: 0.16 mg/dL (ref 0.00–0.40)
Total Protein: 7.9 g/dL (ref 6.0–8.5)

## 2020-12-09 ENCOUNTER — Other Ambulatory Visit: Payer: Self-pay | Admitting: Cardiovascular Disease

## 2020-12-09 ENCOUNTER — Ambulatory Visit (HOSPITAL_COMMUNITY)
Admission: RE | Admit: 2020-12-09 | Discharge: 2020-12-09 | Disposition: A | Discharge status: Home / Self Care | Payer: Commercial Managed Care - PPO | Source: Ambulatory Visit | Attending: Cardiovascular Disease | Admitting: Cardiovascular Disease

## 2020-12-09 ENCOUNTER — Other Ambulatory Visit: Payer: Self-pay

## 2020-12-09 DIAGNOSIS — R079 Chest pain, unspecified: Secondary | ICD-10-CM | POA: Insufficient documentation

## 2020-12-09 DIAGNOSIS — I2510 Atherosclerotic heart disease of native coronary artery without angina pectoris: Secondary | ICD-10-CM

## 2020-12-09 DIAGNOSIS — R931 Abnormal findings on diagnostic imaging of heart and coronary circulation: Secondary | ICD-10-CM

## 2020-12-09 DIAGNOSIS — R072 Precordial pain: Secondary | ICD-10-CM | POA: Diagnosis present

## 2020-12-09 MED ORDER — NITROGLYCERIN 0.4 MG SL SUBL
0.8000 mg | SUBLINGUAL_TABLET | Freq: Once | SUBLINGUAL | Status: AC
  Administered 2020-12-09: 0.8 mg via SUBLINGUAL

## 2020-12-09 MED ORDER — IOHEXOL 350 MG/ML SOLN
100.0000 mL | Freq: Once | INTRAVENOUS | Status: AC | PRN
  Administered 2020-12-09: 100 mL via INTRAVENOUS

## 2020-12-09 MED ORDER — NITROGLYCERIN 0.4 MG SL SUBL
SUBLINGUAL_TABLET | SUBLINGUAL | Status: AC
  Filled 2020-12-09: qty 2

## 2020-12-09 NOTE — Progress Notes (Signed)
CT FFR ordered.   [place] T. Audie Box, MD, [place]  [address], [place], [place] [address] [phone]  11:11 AM

## 2020-12-10 ENCOUNTER — Telehealth: Payer: Self-pay | Admitting: *Deleted

## 2020-12-10 DIAGNOSIS — E7800 Pure hypercholesterolemia, unspecified: Secondary | ICD-10-CM

## 2020-12-10 MED ORDER — ATORVASTATIN CALCIUM 40 MG PO TABS: 40.0000 mg | ORAL_TABLET | Freq: Every day | ORAL | 3 refills | Status: DC

## 2020-12-10 NOTE — Telephone Encounter (Signed)
-----   Message from Lorretta Harp, MD sent at 12/08/2020  5:44 PM EDT ----- Significantly elevated lipid profile with LDL 132.  Begin atorvastatin 40 mg a day and recheck lipid liver in 3 months

## 2020-12-16 ENCOUNTER — Other Ambulatory Visit: Payer: Self-pay

## 2020-12-16 ENCOUNTER — Ambulatory Visit (HOSPITAL_COMMUNITY): Payer: Commercial Managed Care - PPO | Attending: Internal Medicine

## 2020-12-16 DIAGNOSIS — R0602 Shortness of breath: Secondary | ICD-10-CM | POA: Diagnosis present

## 2020-12-16 LAB — ECHOCARDIOGRAM COMPLETE
Area-P 1/2: 2.61 cm2
S' Lateral: 2.8 cm

## 2020-12-18 ENCOUNTER — Ambulatory Visit: Payer: Commercial Managed Care - PPO | Admitting: Cardiovascular Disease

## 2020-12-18 ENCOUNTER — Other Ambulatory Visit: Payer: Self-pay

## 2020-12-18 ENCOUNTER — Encounter: Payer: Self-pay | Admitting: Cardiovascular Disease

## 2020-12-18 VITALS — BP 137/86 | HR 77 | Ht 62.0 in | Wt 145.4 lb

## 2020-12-18 DIAGNOSIS — Z720 Tobacco use: Secondary | ICD-10-CM

## 2020-12-18 DIAGNOSIS — Z01812 Encounter for preprocedural laboratory examination: Secondary | ICD-10-CM | POA: Diagnosis not present

## 2020-12-18 DIAGNOSIS — E782 Mixed hyperlipidemia: Secondary | ICD-10-CM

## 2020-12-18 DIAGNOSIS — E785 Hyperlipidemia, unspecified: Secondary | ICD-10-CM | POA: Insufficient documentation

## 2020-12-18 DIAGNOSIS — R079 Chest pain, unspecified: Secondary | ICD-10-CM | POA: Diagnosis not present

## 2020-12-18 MED ORDER — ISOSORBIDE MONONITRATE ER 30 MG PO TB24: 15.0000 mg | ORAL_TABLET | Freq: Every day | ORAL | 3 refills | Status: DC

## 2020-12-18 NOTE — H&P (View-Only) (Signed)
12/18/2020 [name]   [birth year]  RN:[id number]  Primary Physician Alroy Dust, L.Marlou Sa, MD Primary Cardiologist: Lorretta Harp MD Lupe Carney, [place]  HPI:  Larry Ashley is a 60 y.o.  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 He was referred by the emergency room because of chest pain.  I last saw him in the office 12/02/2020.  His risk factors include 75 pack years of tobacco abuse, strong family history of heart disease with both mother and father who have had CAD in both who are patients of mine.  He is never had a heart attack or stroke.  He is developed chest pain over the last 6 to 8 months occurring once or twice a week lasting up to 30 minutes at a time.  He was recently seen in the ER 11/17/2020 with chest pain.  He ruled out for myocardial infarction.  His EKG showed no acute changes.  I performed 2D echocardiography which was normal and coronary CTA on 12/09/2020 that suggested an occluded RCA.  He has had several episodes of chest pain since I initially saw him.  Based on this, we decided to proceed with diagnostic coronary angiography.  We also discussed smoking cessation.   Current Meds  Medication Sig   atorvastatin (LIPITOR) 40 MG tablet Take 1 tablet (40 mg total) by mouth daily.     No Known Allergies  Social History   Socioeconomic History   Marital status: Married    Spouse name: Not on file   Number of children: Not on file   Years of education: Not on file   Highest education level: Not on file  Occupational History   Not on file  Tobacco Use   Smoking status: Every Day    Packs/day: 1.00    Years: 49.00    Pack years: 49.00    Types: Cigarettes   Smokeless tobacco: Never  Substance and Sexual Activity   Alcohol use: Not on file   Drug use: Not on file   Sexual activity: Not on file  Other Topics Concern    Not on file  Social History Narrative   Not on file   Social Determinants of Health   Financial Resource Strain: Not on file  Food Insecurity: Not on file  Transportation Needs: Not on file  Physical Activity: Not on file  Stress: Not on file  Social Connections: Not on file  Intimate Partner Violence: Not on file     Review of Systems: General: negative for chills, fever, night sweats or weight changes.  Cardiovascular: negative for chest pain, dyspnea on exertion, edema, orthopnea, palpitations, paroxysmal nocturnal dyspnea or shortness of breath Dermatological: negative for rash Respiratory: negative for cough or wheezing Urologic: negative for hematuria Abdominal: negative for nausea, vomiting, diarrhea, bright red blood per rectum, melena, or hematemesis Neurologic: negative for visual changes, syncope, or dizziness All other systems reviewed and are otherwise negative except as noted above.    Blood pressure 137/86, pulse 77, height '5\' 2"'$  (1.575 m), weight 145 lb 6.4 oz (66 kg), SpO2 98 %.  General appearance: alert and no distress Neck: no adenopathy, no carotid bruit, no JVD, supple, symmetrical, trachea midline, and thyroid not enlarged, symmetric, no tenderness/mass/nodules Lungs: clear to auscultation bilaterally Heart: regular rate and rhythm, S1, S2 normal, no murmur, click, rub or gallop Extremities:  extremities normal, atraumatic, no cyanosis or edema Pulses: 2+ and symmetric Skin: Skin color, texture, turgor normal. No rashes or lesions Neurologic: Grossly normal  EKG sinus rhythm at 77 without ST or T wave changes.  I personally reviewed this EKG.  ASSESSMENT AND PLAN:   Hyperlipidemia History of hyperlipidemia lipid profile performed 12/08/2020 revealed a total cholesterol of 222, LDL of 132 and HDL 37 recently begun on atorvastatin.  We will recheck a lipid liver profile in 3 months  Chest pain of uncertain etiology History of chest pain with recent CTA  performed 12/09/2020 which suggested an occluded RCA.  Based on this, cardiac catheterization will be recommended.  Because of his recurrent episodes of chest pain I am going to begin him on low-dose Imdur.  I have reviewed the risks, indications, and alternatives to cardiac catheterization, possible angioplasty, and stenting with the patient. Risks include but are not limited to bleeding, infection, vascular injury, stroke, myocardial infection, arrhythmia, kidney injury, radiation-related injury in the case of prolonged fluoroscopy use, emergency cardiac surgery, and death. The patient understands the risks of serious complication is 1-2 in 123XX123 with diagnostic cardiac cath and 1-2% or less with angioplasty/stenting.    Tobacco abuse Ongoing tobacco abuse with intentions to quit.     Lorretta Harp MD FACP,FACC,FAHA, [facility] 12/18/2020 1:44 PM

## 2020-12-18 NOTE — Assessment & Plan Note (Signed)
Ongoing tobacco abuse with intentions to quit.

## 2020-12-18 NOTE — Assessment & Plan Note (Signed)
History of hyperlipidemia lipid profile performed 12/08/2020 revealed a total cholesterol of 222, LDL of 132 and HDL 37 recently begun on atorvastatin.  We will recheck a lipid liver profile in 3 months

## 2020-12-18 NOTE — Patient Instructions (Signed)
  [place] [address] Dept: [phone] Loc: [place]  12/18/2020  You are scheduled for a Cardiac Catheterization on Monday, August 22 with Dr. Quay Burow.  1. Please arrive at the [facility] (Main Entrance A) at [facility]: [address] [place], [place] [address] at 9:00 AM (This time is two hours before your procedure to ensure your preparation). Free valet parking service is available.   Special note: Every effort is made to have your procedure done on time. Please understand that emergencies sometimes delay scheduled procedures.  2. Diet: Do not eat solid foods after midnight.  The patient may have clear liquids until 5am upon the day of the procedure.  3. Labs: Today in office   4. Medication instructions in preparation for your procedure:   Contrast Allergy: No  On the morning of your procedure, take your Aspirin and any morning medicines NOT listed above.  You may use sips of water.  5. Plan for one night stay--bring personal belongings. 6. Bring a current list of your medications and current insurance cards. 7. You MUST have a responsible person to drive you home. 8. Someone MUST be with you the first 24 hours after you arrive home or your discharge will be delayed. 9. Please wear clothes that are easy to get on and off and wear slip-on shoes.  Thank you for allowing [place] to care for you!   -- [place] [facility]    START isosorbide (Imdur) 15 mg daily  Follow up 2 weeks after procedure with Dr. Gwenlyn Found

## 2020-12-18 NOTE — Assessment & Plan Note (Signed)
History of chest pain with recent CTA performed 12/09/2020 which suggested an occluded RCA.  Based on this, cardiac catheterization will be recommended.  Because of his recurrent episodes of chest pain I am going to begin him on low-dose Imdur. I have reviewed the risks, indications, and alternatives to cardiac catheterization, possible angioplasty, and stenting with the patient. Risks include but are not limited to bleeding, infection, vascular injury, stroke, myocardial infection, arrhythmia, kidney injury, radiation-related injury in the case of prolonged fluoroscopy use, emergency cardiac surgery, and death. The patient understands the risks of serious complication is 1-2 in 123XX123 with diagnostic cardiac cath and 1-2% or less with angioplasty/stenting.

## 2020-12-18 NOTE — Progress Notes (Signed)
12/18/2020 [name]   [birth year]  RN:[id number]  Primary Physician Alroy Dust, L.Marlou Sa, MD Primary Cardiologist: Lorretta Harp MD Lupe Carney, [place]  HPI:  Larry Ashley is a 60 y.o.  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 He was referred by the emergency room because of chest pain.  I last saw him in the office 12/02/2020.  His risk factors include 75 pack years of tobacco abuse, strong family history of heart disease with both mother and father who have had CAD in both who are patients of mine.  He is never had a heart attack or stroke.  He is developed chest pain over the last 6 to 8 months occurring once or twice a week lasting up to 30 minutes at a time.  He was recently seen in the ER 11/17/2020 with chest pain.  He ruled out for myocardial infarction.  His EKG showed no acute changes.  I performed 2D echocardiography which was normal and coronary CTA on 12/09/2020 that suggested an occluded RCA.  He has had several episodes of chest pain since I initially saw him.  Based on this, we decided to proceed with diagnostic coronary angiography.  We also discussed smoking cessation.   Current Meds  Medication Sig   atorvastatin (LIPITOR) 40 MG tablet Take 1 tablet (40 mg total) by mouth daily.     No Known Allergies  Social History   Socioeconomic History   Marital status: Married    Spouse name: Not on file   Number of children: Not on file   Years of education: Not on file   Highest education level: Not on file  Occupational History   Not on file  Tobacco Use   Smoking status: Every Day    Packs/day: 1.00    Years: 49.00    Pack years: 49.00    Types: Cigarettes   Smokeless tobacco: Never  Substance and Sexual Activity   Alcohol use: Not on file   Drug use: Not on file   Sexual activity: Not on file  Other Topics Concern    Not on file  Social History Narrative   Not on file   Social Determinants of Health   Financial Resource Strain: Not on file  Food Insecurity: Not on file  Transportation Needs: Not on file  Physical Activity: Not on file  Stress: Not on file  Social Connections: Not on file  Intimate Partner Violence: Not on file     Review of Systems: General: negative for chills, fever, night sweats or weight changes.  Cardiovascular: negative for chest pain, dyspnea on exertion, edema, orthopnea, palpitations, paroxysmal nocturnal dyspnea or shortness of breath Dermatological: negative for rash Respiratory: negative for cough or wheezing Urologic: negative for hematuria Abdominal: negative for nausea, vomiting, diarrhea, bright red blood per rectum, melena, or hematemesis Neurologic: negative for visual changes, syncope, or dizziness All other systems reviewed and are otherwise negative except as noted above.    Blood pressure 137/86, pulse 77, height '5\' 2"'$  (1.575 m), weight 145 lb 6.4 oz (66 kg), SpO2 98 %.  General appearance: alert and no distress Neck: no adenopathy, no carotid bruit, no JVD, supple, symmetrical, trachea midline, and thyroid not enlarged, symmetric, no tenderness/mass/nodules Lungs: clear to auscultation bilaterally Heart: regular rate and rhythm, S1, S2 normal, no murmur, click, rub or gallop Extremities:  extremities normal, atraumatic, no cyanosis or edema Pulses: 2+ and symmetric Skin: Skin color, texture, turgor normal. No rashes or lesions Neurologic: Grossly normal  EKG sinus rhythm at 77 without ST or T wave changes.  I personally reviewed this EKG.  ASSESSMENT AND PLAN:   Hyperlipidemia History of hyperlipidemia lipid profile performed 12/08/2020 revealed a total cholesterol of 222, LDL of 132 and HDL 37 recently begun on atorvastatin.  We will recheck a lipid liver profile in 3 months  Chest pain of uncertain etiology History of chest pain with recent CTA  performed 12/09/2020 which suggested an occluded RCA.  Based on this, cardiac catheterization will be recommended.  Because of his recurrent episodes of chest pain I am going to begin him on low-dose Imdur.  I have reviewed the risks, indications, and alternatives to cardiac catheterization, possible angioplasty, and stenting with the patient. Risks include but are not limited to bleeding, infection, vascular injury, stroke, myocardial infection, arrhythmia, kidney injury, radiation-related injury in the case of prolonged fluoroscopy use, emergency cardiac surgery, and death. The patient understands the risks of serious complication is 1-2 in 123XX123 with diagnostic cardiac cath and 1-2% or less with angioplasty/stenting.    Tobacco abuse Ongoing tobacco abuse with intentions to quit.     Lorretta Harp MD FACP,FACC,FAHA, [facility] 12/18/2020 1:44 PM

## 2020-12-21 ENCOUNTER — Other Ambulatory Visit: Payer: Self-pay | Admitting: *Deleted

## 2020-12-21 DIAGNOSIS — R079 Chest pain, unspecified: Secondary | ICD-10-CM

## 2020-12-21 MED ORDER — SODIUM CHLORIDE 0.9% FLUSH
3.0000 mL | Freq: Two times a day (BID) | INTRAVENOUS | Status: DC
Start: 2020-12-21 — End: 2021-07-09

## 2020-12-22 ENCOUNTER — Telehealth: Payer: Self-pay | Admitting: *Deleted

## 2020-12-22 LAB — CBC
Hematocrit: 48.5 % (ref 37.5–51.0)
Hemoglobin: 16.3 g/dL (ref 13.0–17.7)
MCH: 32.8 pg (ref 26.6–33.0)
MCHC: 33.6 g/dL (ref 31.5–35.7)
MCV: 98 fL — ABNORMAL HIGH (ref 79–97)
Platelets: 200 10*3/uL (ref 150–450)
RBC: 4.97 x10E6/uL (ref 4.14–5.80)
RDW: 12.1 % (ref 11.6–15.4)
WBC: 10.1 10*3/uL (ref 3.4–10.8)

## 2020-12-22 LAB — BASIC METABOLIC PANEL
BUN/Creatinine Ratio: 12 (ref 9–20)
BUN: 10 mg/dL (ref 6–24)
CO2: 20 mmol/L (ref 20–29)
Calcium: 9.6 mg/dL (ref 8.7–10.2)
Chloride: 101 mmol/L (ref 96–106)
Creatinine, Ser: 0.86 mg/dL (ref 0.76–1.27)
Glucose: 61 mg/dL — ABNORMAL LOW (ref 65–99)
Potassium: 4.4 mmol/L (ref 3.5–5.2)
Sodium: 140 mmol/L (ref 134–144)
eGFR: 100 mL/min/{1.73_m2} (ref >59)

## 2020-12-22 NOTE — Telephone Encounter (Signed)
Do not see 12/18/20 CBC/BMP results in Epic. Call placed to [employer], spoke with Jeannene Patella, was told waiting for account verification, I provided account number for [employer], results are now in [place].

## 2020-12-24 NOTE — Telephone Encounter (Signed)
Cardiac catheterization scheduled at [facility] for: Monday December 28, 2020 [facility] Main Entrance A [facility]) at: 9 AM   No solid food after midnight prior to cath, clear liquids until 5 AM day of procedure.   Morning medications can be taken pre-cath with sips of water including aspirin 81 mg.    Confirmed patient has responsible adult to drive home post procedure and be with patient first 24 hours after arriving home.  Patients are allowed one visitor in the waiting room during the time they are at the hospital for their procedure. Both patient and visitor must wear a mask once they enter the hospital.   Patient reports does not currently have any symptoms concerning for COVID-19 and no household members with COVID-19 like illness.   Reviewed procedure/mask/visitor instructions with patient.

## 2020-12-28 ENCOUNTER — Other Ambulatory Visit: Payer: Self-pay

## 2020-12-28 ENCOUNTER — Encounter (HOSPITAL_COMMUNITY)
Admission: RE | Disposition: A | Discharge status: Home / Self Care | Payer: Self-pay | Source: Home / Self Care | Attending: Cardiovascular Disease

## 2020-12-28 ENCOUNTER — Encounter: Payer: Self-pay | Admitting: *Deleted

## 2020-12-28 ENCOUNTER — Ambulatory Visit (HOSPITAL_COMMUNITY)
Admission: RE | Admit: 2020-12-28 | Discharge: 2020-12-28 | Disposition: A | Discharge status: Home / Self Care | Payer: Commercial Managed Care - PPO | Attending: Cardiovascular Disease | Admitting: Cardiovascular Disease

## 2020-12-28 DIAGNOSIS — I2510 Atherosclerotic heart disease of native coronary artery without angina pectoris: Secondary | ICD-10-CM | POA: Insufficient documentation

## 2020-12-28 DIAGNOSIS — R079 Chest pain, unspecified: Secondary | ICD-10-CM | POA: Diagnosis present

## 2020-12-28 DIAGNOSIS — Z87891 Personal history of nicotine dependence: Secondary | ICD-10-CM | POA: Diagnosis not present

## 2020-12-28 DIAGNOSIS — I2582 Chronic total occlusion of coronary artery: Secondary | ICD-10-CM | POA: Diagnosis not present

## 2020-12-28 DIAGNOSIS — E785 Hyperlipidemia, unspecified: Secondary | ICD-10-CM | POA: Insufficient documentation

## 2020-12-28 DIAGNOSIS — Z8249 Family history of ischemic heart disease and other diseases of the circulatory system: Secondary | ICD-10-CM | POA: Diagnosis not present

## 2020-12-28 DIAGNOSIS — Z006 Encounter for examination for normal comparison and control in clinical research program: Secondary | ICD-10-CM

## 2020-12-28 DIAGNOSIS — Z79899 Other long term (current) drug therapy: Secondary | ICD-10-CM | POA: Diagnosis not present

## 2020-12-28 HISTORY — PX: LEFT HEART CATH AND CORONARY ANGIOGRAPHY: CATH118249

## 2020-12-28 SURGERY — LEFT HEART CATH AND CORONARY ANGIOGRAPHY
Anesthesia: LOCAL

## 2020-12-28 MED ORDER — SODIUM CHLORIDE 0.9% FLUSH: 3.0000 mL | INTRAVENOUS | Status: DC | PRN

## 2020-12-28 MED ORDER — SODIUM CHLORIDE 0.9 % WEIGHT BASED INFUSION
3.0000 mL/kg/h | INTRAVENOUS | Status: AC
  Administered 2020-12-28: 3 mL/kg/h via INTRAVENOUS

## 2020-12-28 MED ORDER — LIDOCAINE HCL (PF) 1 % IJ SOLN
INTRAMUSCULAR | Status: AC
  Filled 2020-12-28: qty 30

## 2020-12-28 MED ORDER — ASPIRIN 81 MG PO CHEW: 81.0000 mg | CHEWABLE_TABLET | ORAL | Status: DC

## 2020-12-28 MED ORDER — MORPHINE SULFATE (PF) 2 MG/ML IV SOLN: 2.0000 mg | INTRAVENOUS | Status: DC | PRN

## 2020-12-28 MED ORDER — IOHEXOL 350 MG/ML SOLN
INTRAVENOUS | Status: DC | PRN
  Administered 2020-12-28: 48 mL

## 2020-12-28 MED ORDER — FENTANYL CITRATE (PF) 100 MCG/2ML IJ SOLN
INTRAMUSCULAR | Status: AC
  Filled 2020-12-28: qty 2

## 2020-12-28 MED ORDER — MIDAZOLAM HCL 2 MG/2ML IJ SOLN
INTRAMUSCULAR | Status: DC | PRN
  Administered 2020-12-28: 1 mg via INTRAVENOUS

## 2020-12-28 MED ORDER — ACETAMINOPHEN 325 MG PO TABS: 650.0000 mg | ORAL_TABLET | ORAL | Status: DC | PRN

## 2020-12-28 MED ORDER — SODIUM CHLORIDE 0.9 % WEIGHT BASED INFUSION: 1.0000 mL/kg/h | INTRAVENOUS | Status: DC

## 2020-12-28 MED ORDER — HEPARIN (PORCINE) IN NACL 1000-0.9 UT/500ML-% IV SOLN
INTRAVENOUS | Status: AC
  Filled 2020-12-28: qty 1000

## 2020-12-28 MED ORDER — VERAPAMIL HCL 2.5 MG/ML IV SOLN
INTRAVENOUS | Status: AC
  Filled 2020-12-28: qty 2

## 2020-12-28 MED ORDER — SODIUM CHLORIDE 0.9 % IV SOLN: 250.0000 mL | INTRAVENOUS | Status: DC | PRN

## 2020-12-28 MED ORDER — MIDAZOLAM HCL 2 MG/2ML IJ SOLN
INTRAMUSCULAR | Status: AC
  Filled 2020-12-28: qty 2

## 2020-12-28 MED ORDER — HEPARIN SODIUM (PORCINE) 1000 UNIT/ML IJ SOLN
INTRAMUSCULAR | Status: AC
  Filled 2020-12-28: qty 1

## 2020-12-28 MED ORDER — FENTANYL CITRATE (PF) 100 MCG/2ML IJ SOLN
INTRAMUSCULAR | Status: DC | PRN
  Administered 2020-12-28: 25 ug via INTRAVENOUS

## 2020-12-28 MED ORDER — LABETALOL HCL 5 MG/ML IV SOLN: 10.0000 mg | INTRAVENOUS | Status: DC | PRN

## 2020-12-28 MED ORDER — ONDANSETRON HCL 4 MG/2ML IJ SOLN: 4.0000 mg | Freq: Four times a day (QID) | INTRAMUSCULAR | Status: DC | PRN

## 2020-12-28 MED ORDER — HEPARIN (PORCINE) IN NACL 1000-0.9 UT/500ML-% IV SOLN
INTRAVENOUS | Status: DC | PRN
  Administered 2020-12-28 (×2): 500 mL

## 2020-12-28 MED ORDER — VERAPAMIL HCL 2.5 MG/ML IV SOLN
INTRA_ARTERIAL | Status: DC | PRN
  Administered 2020-12-28: 15 mL via INTRA_ARTERIAL

## 2020-12-28 MED ORDER — SODIUM CHLORIDE 0.9 % IV SOLN: INTRAVENOUS | Status: DC

## 2020-12-28 MED ORDER — HEPARIN SODIUM (PORCINE) 1000 UNIT/ML IJ SOLN
INTRAMUSCULAR | Status: DC | PRN
  Administered 2020-12-28: 3500 [IU] via INTRAVENOUS

## 2020-12-28 MED ORDER — LIDOCAINE HCL (PF) 1 % IJ SOLN
INTRAMUSCULAR | Status: DC | PRN
  Administered 2020-12-28: 2 mL

## 2020-12-28 MED ORDER — HYDRALAZINE HCL 20 MG/ML IJ SOLN: 10.0000 mg | INTRAMUSCULAR | Status: DC | PRN

## 2020-12-28 MED ORDER — NITROGLYCERIN 1 MG/10 ML FOR IR/CATH LAB
INTRA_ARTERIAL | Status: AC
  Filled 2020-12-28: qty 10

## 2020-12-28 MED ORDER — ATORVASTATIN CALCIUM 80 MG PO TABS
80.0000 mg | ORAL_TABLET | Freq: Every day | ORAL | Status: DC
  Filled 2020-12-28: qty 1

## 2020-12-28 MED ORDER — ASPIRIN 81 MG PO CHEW: 81.0000 mg | CHEWABLE_TABLET | Freq: Every day | ORAL | Status: DC

## 2020-12-28 MED ORDER — SODIUM CHLORIDE 0.9% FLUSH: 3.0000 mL | Freq: Two times a day (BID) | INTRAVENOUS | Status: DC

## 2020-12-28 SURGICAL SUPPLY — 12 items
CATH INFINITI 5FR ANG PIGTAIL (CATHETERS) ×1 IMPLANT
CATH OPTITORQUE TIG 4.0 5F (CATHETERS) ×1 IMPLANT
DEVICE RAD COMP TR BAND LRG (VASCULAR PRODUCTS) ×1 IMPLANT
GLIDESHEATH SLEND A-KIT 6F 22G (SHEATH) ×1 IMPLANT
GUIDEWIRE INQWIRE 1.5J.035X260 (WIRE) IMPLANT
INQWIRE 1.5J .035X260CM (WIRE) ×2
KIT HEART LEFT (KITS) ×2 IMPLANT
PACK CARDIAC CATHETERIZATION (CUSTOM PROCEDURE TRAY) ×2 IMPLANT
SYR MEDRAD MARK 7 150ML (SYRINGE) ×2 IMPLANT
TRANSDUCER W/STOPCOCK (MISCELLANEOUS) ×2 IMPLANT
TUBING CIL FLEX 10 FLL-RA (TUBING) ×2 IMPLANT
WIRE HI TORQ VERSACORE-J 145CM (WIRE) ×1 IMPLANT

## 2020-12-28 NOTE — Interval H&P Note (Signed)
Cath Lab Visit (complete for each Cath Lab visit)  Clinical Evaluation Leading to the Procedure:   ACS: No.  Non-ACS:    Anginal Classification: CCS II  Anti-ischemic medical therapy: Minimal Therapy (1 class of medications)  Non-Invasive Test Results: No non-invasive testing performed  Prior CABG: No previous CABG      History and Physical Interval Note:  12/28/2020 11:03 AM  Larry Ashley  has presented today for surgery, with the diagnosis of chest pain.  The various methods of treatment have been discussed with the patient and family. After consideration of risks, benefits and other options for treatment, the patient has consented to  Procedure(s): LEFT HEART CATH AND CORONARY ANGIOGRAPHY (N/A) as a surgical intervention.  The patient's history has been reviewed, patient examined, no change in status, stable for surgery.  I have reviewed the patient's chart and labs.  Questions were answered to the patient's satisfaction.     Quay Burow

## 2020-12-28 NOTE — Research (Signed)
Identify Informed Consent   Subject Name: Larry Ashley  Subject met inclusion and exclusion criteria.  The informed consent form, study requirements and expectations were reviewed with the subject and questions and concerns were addressed prior to the signing of the consent form.  The subject verbalized understanding of the trial requirements.  The subject agreed to participate in the Identify trial and signed the informed consent at 1009 on 28-Dec-2020.  The informed consent was obtained prior to performance of any protocol-specific procedures for the subject.  A copy of the signed informed consent was given to the subject and a copy was placed in the subject's medical record.   Jasmine Pang, RN BSN [facility] Cardiovascular Research & Education Direct Line: [phone]

## 2020-12-29 ENCOUNTER — Ambulatory Visit: Payer: Commercial Managed Care - PPO | Admitting: Cardiovascular Disease

## 2020-12-29 ENCOUNTER — Encounter (HOSPITAL_COMMUNITY): Payer: Self-pay | Admitting: Cardiovascular Disease

## 2021-01-13 ENCOUNTER — Encounter: Payer: Self-pay | Admitting: Cardiovascular Disease

## 2021-01-13 ENCOUNTER — Ambulatory Visit: Payer: Commercial Managed Care - PPO | Admitting: Cardiovascular Disease

## 2021-01-13 ENCOUNTER — Other Ambulatory Visit: Payer: Self-pay

## 2021-01-13 VITALS — BP 132/86 | HR 72 | Resp 20 | Ht 62.0 in | Wt 147.4 lb

## 2021-01-13 DIAGNOSIS — I2510 Atherosclerotic heart disease of native coronary artery without angina pectoris: Secondary | ICD-10-CM | POA: Insufficient documentation

## 2021-01-13 DIAGNOSIS — E7800 Pure hypercholesterolemia, unspecified: Secondary | ICD-10-CM | POA: Diagnosis not present

## 2021-01-13 DIAGNOSIS — Z720 Tobacco use: Secondary | ICD-10-CM | POA: Diagnosis not present

## 2021-01-13 NOTE — Assessment & Plan Note (Signed)
History of recently discontinued tobacco abuse on 12/26/2018.

## 2021-01-13 NOTE — Progress Notes (Signed)
01/13/2021 Larry Ashley   01/07/1961  RN:[id number]  Primary Physician Alroy Dust, L.Marlou Sa, MD Primary Cardiologist: Lorretta Harp MD Lupe Carney, [place]  HPI:  Larry Ashley is a 60 y.o.  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  I last saw him in the office 12/18/2020.  He is accompanied by his wife today.Larry Ashley  His risk factors include 75 pack years of tobacco abuse, strong family history of heart disease with both mother and father who have had CAD in both who are patients of mine.  He is never had a heart attack or stroke.  He is developed chest pain over the last 6 to 8 months occurring once or twice a week lasting up to 30 minutes at a time.  He was recently seen in the ER 11/17/2020 with chest pain.  He ruled out for myocardial infarction.  His EKG showed no acute changes.  I performed 2D echocardiography which was normal and coronary CTA on 12/09/2020 that suggested an occluded RCA.  He has had several episodes of chest pain since I initially saw him.  Based on this, we decided to proceed with diagnostic coronary angiography.  We also discussed smoking cessation.  Since I saw him in the office a month ago he did stop smoking on 12/25/2020.  I performed cardiac catheterization on him 12/28/2020 revealing an occluded dominant RCA with left-to-right collaterals and ostial diagonal branch stenosis.  His chest pain significantly improved with the addition of low-dose long-acting oral nitrate.  I also began him on a statin drug.   Current Meds  Medication Sig   atorvastatin (LIPITOR) 40 MG tablet Take 1 tablet (40 mg total) by mouth daily.   isosorbide mononitrate (IMDUR) 30 MG 24 hr tablet Take 0.5 tablets (15 mg total) by mouth daily.   Multiple Vitamins-Minerals (MULTIVITAMIN WITH MINERALS) tablet  Take 1 tablet by mouth daily. men   NAPROXEN PO Take 220 mg by mouth daily as needed (Headache).   Omega-3 Fatty Acids (OMEGA-3 PO) Take 3,600 mg by mouth daily.   Current Facility-Administered Medications for the 01/13/21 encounter (Office Visit) with Lorretta Harp, MD  Medication   sodium chloride flush (NS) 0.9 % injection 3 mL     No Known Allergies  Social History   Socioeconomic History   Marital status: Married    Spouse name: Not on file   Number of children: Not on file   Years of education: Not on file   Highest education level: Not on file  Occupational History   Not on file  Tobacco Use   Smoking status: Every Day    Packs/day: 1.00    Years: 49.00    Pack years: 49.00    Types: Cigarettes   Smokeless tobacco: Never  Substance and Sexual Activity   Alcohol use: Not on file   Drug use: Not on file   Sexual activity: Not on file  Other Topics Concern   Not on file  Social History Narrative   Not on file   Social Determinants of Health   Financial Resource Strain: Not on file  Food Insecurity: Not on file  Transportation Needs: Not on file  Physical Activity: Not on file  Stress: Not on file  Social Connections: Not on file  Intimate Partner  Violence: Not on file     Review of Systems: General: negative for chills, fever, night sweats or weight changes.  Cardiovascular: negative for chest pain, dyspnea on exertion, edema, orthopnea, palpitations, paroxysmal nocturnal dyspnea or shortness of breath Dermatological: negative for rash Respiratory: negative for cough or wheezing Urologic: negative for hematuria Abdominal: negative for nausea, vomiting, diarrhea, bright red blood per rectum, melena, or hematemesis Neurologic: negative for visual changes, syncope, or dizziness All other systems reviewed and are otherwise negative except as noted above.    Blood pressure 132/86, pulse 72, resp. rate 20, height '5\' 2"'$  (1.575 m), weight 147 lb 6.4 oz (66.9  kg), SpO2 98 %.  General appearance: alert and no distress Neck: no adenopathy, no carotid bruit, no JVD, supple, symmetrical, trachea midline, and thyroid not enlarged, symmetric, no tenderness/mass/nodules Lungs: clear to auscultation bilaterally Heart: regular rate and rhythm, S1, S2 normal, no murmur, click, rub or gallop Extremities: extremities normal, atraumatic, no cyanosis or edema Pulses: 2+ and symmetric Skin: Skin color, texture, turgor normal. No rashes or lesions Neurologic: Grossly normal  EKG not performed today  ASSESSMENT AND PLAN:   Tobacco abuse History of recently discontinued tobacco abuse on 12/26/2018.  Hyperlipidemia History of hyperlipidemia recently started on statin therapy last month (atorvastatin 40 mg a day) with a lipid profile performed 12/08/2020 revealing total cholesterol of 222, LDL of 132 and HDL 37.  His LDL goal is less than 70.  We will recheck a fasting lipid and liver profile in 2 months.  Coronary artery disease History of CAD status post coronary CTA performed 12/09/2020 that suggested an RCA CTO.  This was confirmed on cardiac catheterization which I performed radially 12/28/2020 revealing an occluded dominant RCA with left-to-right collaterals and a diagonal branch ostial stenosis with normal LV function.  I did begin him on low-dose long-acting oral nitrate which has resulted in marked improvement in his angina.  I have asked him to return to his normal physical activities.  If he has recurrent chest pain we can titrate his nitrate and/or consider CTO intervention.     Lorretta Harp MD [place], [facility] 01/13/2021 10:27 AM

## 2021-01-13 NOTE — Patient Instructions (Signed)
Medication Instructions:  NO CHANGES *If you need a refill on your cardiac medications before your next appointment, please call your pharmacy*   Lab Work: LIPID AND LIVER 2 MO FIRST WEEK OF NOVEMBER If you have labs (blood work) drawn today and your tests are completely normal, you will receive your results only by: [place] (if you have MyChart) OR A paper copy in the mail If you have any lab test that is abnormal or we need to change your treatment, we will call you to review the results.   Testing/Procedures: NONE   Follow-Up: At [facility], you and your health needs are our priority.  As part of our continuing mission to provide you with exceptional heart care, we have created designated Provider Care Teams.  These Care Teams include your primary Cardiologist (physician) and Advanced Practice Providers (APPs -  Physician Assistants and Nurse Practitioners) who all work together to provide you with the care you need, when you need it.  We recommend signing up for the patient portal called "MyChart".  Sign up information is provided on this After Visit Summary.  MyChart is used to connect with patients for Virtual Visits (Telemedicine).  Patients are able to view lab/test results, encounter notes, upcoming appointments, etc.  Non-urgent messages can be sent to your provider as well.   To learn more about what you can do with MyChart, go to [email or web address].    Your next appointment:   6 month(s)  The format for your next appointment:   In Person  Provider:   Quay Burow, MD   Other Instructions  NONE

## 2021-01-13 NOTE — Assessment & Plan Note (Signed)
History of hyperlipidemia recently started on statin therapy last month (atorvastatin 40 mg a day) with a lipid profile performed 12/08/2020 revealing total cholesterol of 222, LDL of 132 and HDL 37.  His LDL goal is less than 70.  We will recheck a fasting lipid and liver profile in 2 months.

## 2021-01-13 NOTE — Assessment & Plan Note (Signed)
History of CAD status post coronary CTA performed 12/09/2020 that suggested an RCA CTO.  This was confirmed on cardiac catheterization which I performed radially 12/28/2020 revealing an occluded dominant RCA with left-to-right collaterals and a diagonal branch ostial stenosis with normal LV function.  I did begin him on low-dose long-acting oral nitrate which has resulted in marked improvement in his angina.  I have asked him to return to his normal physical activities.  If he has recurrent chest pain we can titrate his nitrate and/or consider CTO intervention.

## 2021-02-12 ENCOUNTER — Ambulatory Visit: Payer: Commercial Managed Care - PPO | Admitting: Cardiovascular Disease

## 2021-06-09 ENCOUNTER — Encounter (HOSPITAL_COMMUNITY): Payer: Self-pay | Admitting: Emergency Medicine

## 2021-06-09 ENCOUNTER — Telehealth: Payer: Self-pay | Admitting: Cardiovascular Disease

## 2021-06-09 ENCOUNTER — Emergency Department (HOSPITAL_COMMUNITY)
Admission: EM | Admit: 2021-06-09 | Discharge: 2021-06-09 | Disposition: A | Discharge status: Home / Self Care | Payer: Commercial Managed Care - PPO | Attending: Emergency Medicine | Admitting: Emergency Medicine

## 2021-06-09 ENCOUNTER — Emergency Department (HOSPITAL_COMMUNITY): Payer: Commercial Managed Care - PPO

## 2021-06-09 DIAGNOSIS — Z5321 Procedure and treatment not carried out due to patient leaving prior to being seen by health care provider: Secondary | ICD-10-CM | POA: Diagnosis not present

## 2021-06-09 DIAGNOSIS — R079 Chest pain, unspecified: Secondary | ICD-10-CM | POA: Insufficient documentation

## 2021-06-09 HISTORY — DX: Atherosclerotic heart disease of native coronary artery without angina pectoris: I25.10

## 2021-06-09 LAB — CBC
HCT: 44.1 % (ref 39.0–52.0)
Hemoglobin: 15.5 g/dL (ref 13.0–17.0)
MCH: 32.3 pg (ref 26.0–34.0)
MCHC: 35.1 g/dL (ref 30.0–36.0)
MCV: 91.9 fL (ref 80.0–100.0)
Platelets: 207 10*3/uL (ref 150–400)
RBC: 4.8 MIL/uL (ref 4.22–5.81)
RDW: 12.7 % (ref 11.5–15.5)
WBC: 7 10*3/uL (ref 4.0–10.5)
nRBC: 0 % (ref 0.0–0.2)

## 2021-06-09 LAB — BASIC METABOLIC PANEL
Anion gap: 11 (ref 5–15)
BUN: 12 mg/dL (ref 6–20)
CO2: 21 mmol/L — ABNORMAL LOW (ref 22–32)
Calcium: 9.7 mg/dL (ref 8.9–10.3)
Chloride: 106 mmol/L (ref 98–111)
Creatinine, Ser: 0.93 mg/dL (ref 0.61–1.24)
GFR, Estimated: >60 mL/min (ref >60)
Glucose, Bld: 101 mg/dL — ABNORMAL HIGH (ref 70–99)
Potassium: 3.8 mmol/L (ref 3.5–5.1)
Sodium: 138 mmol/L (ref 135–145)

## 2021-06-09 LAB — TROPONIN I (HIGH SENSITIVITY): Troponin I (High Sensitivity): 4 ng/L (ref <18)

## 2021-06-09 NOTE — ED Notes (Signed)
No answer for triage room

## 2021-06-09 NOTE — Telephone Encounter (Signed)
Patient called in, stating that he is having chest tightness, short of breath on the phone, he states it started a few hours ago and has been continuous. He is lightheaded, having arm weakness and states he is just not feeling well and feels "weird". I did review his chart, but with symptoms occurring he is having someone drive him right now- was questioning to come here or go to ED. I did advise him to go to ED to be evaluated.  Patient will do this, I will route to MD to make aware.

## 2021-06-09 NOTE — ED Provider Triage Note (Addendum)
Emergency Medicine Provider Triage Evaluation Note  Larry Ashley , a 61 y.o. male  was evaluated in triage.  Pt complains of chest tightness since this morning at 6:30 AM.  Patient currently being seen by Dr. Alvester Chou of cardiology, patient states that he has 100% blockage of unspecified vessel, 50% and 30% blockage of other vessels.  Patient states that he is having chest tightness, refuses to describe it as a pain.  Patient states that the pain is worsened when he stands up.  Patient states that he takes half of a nitro tab every morning as directed by his doctor, patient states that nitro tablet did not relieve his chest discomfort this morning.  Review of Systems  Positive: Chest tightness, lightheadedness Negative: Nausea, vomiting, shortness of breath  Physical Exam  BP (!) 157/104    Pulse (!) 103    Temp (!) 97.4 F (36.3 C) (Oral)    Resp (!) 22    SpO2 100%  Gen:   Awake, no distress   Resp:  Normal effort.  MSK:   Moves extremities without difficulty  Other:    Medical Decision Making  Medically screening exam initiated at 9:42 AM.  Appropriate orders placed.  GENEVIEVE RITZEL was informed that the remainder of the evaluation will be completed by another provider, this initial triage assessment does not replace that evaluation, and the importance of remaining in the ED until their evaluation is complete.      Azucena Cecil, PA-C 06/09/21 [phone]

## 2021-06-09 NOTE — Telephone Encounter (Signed)
Pt c/o of Chest Pain: STAT if CP now or developed within 24 hours  1. Are you having CP right now? Chest tightness   2. Are you experiencing any other symptoms (ex. SOB, nausea, vomiting, sweating)? Having shoulder and arm weaknes, and lightheaded.   3. How long have you been experiencing CP? Start a few hours  4. Is your CP continuous or coming and going? continuous  5. Have you taken Nitroglycerin? Yes  ?

## 2021-06-09 NOTE — ED Notes (Signed)
No answer for treatment room. 

## 2021-06-09 NOTE — ED Triage Notes (Signed)
Pt. Stated, Im having chest pain and my arms are weak, and SOB and feel like Im going to pass out

## 2021-07-02 ENCOUNTER — Other Ambulatory Visit: Payer: Self-pay

## 2021-07-02 DIAGNOSIS — E7800 Pure hypercholesterolemia, unspecified: Secondary | ICD-10-CM

## 2021-07-02 LAB — HEPATIC FUNCTION PANEL
ALT: 35 IU/L (ref 0–44)
AST: 30 IU/L (ref 0–40)
Albumin: 4.5 g/dL (ref 3.8–4.9)
Alkaline Phosphatase: 100 IU/L (ref 44–121)
Bilirubin Total: 0.4 mg/dL (ref 0.0–1.2)
Bilirubin, Direct: 0.13 mg/dL (ref 0.00–0.40)
Total Protein: 7.1 g/dL (ref 6.0–8.5)

## 2021-07-02 LAB — LIPID PANEL
Chol/HDL Ratio: 2.5 ratio (ref 0.0–5.0)
Cholesterol, Total: 149 mg/dL (ref 100–199)
HDL: 59 mg/dL (ref >39)
LDL Chol Calc (NIH): 75 mg/dL (ref 0–99)
Triglycerides: 77 mg/dL (ref 0–149)
VLDL Cholesterol Cal: 15 mg/dL (ref 5–40)

## 2021-07-09 ENCOUNTER — Other Ambulatory Visit: Payer: Self-pay

## 2021-07-09 ENCOUNTER — Ambulatory Visit: Payer: Commercial Managed Care - PPO | Admitting: Cardiovascular Disease

## 2021-07-09 ENCOUNTER — Encounter: Payer: Self-pay | Admitting: Cardiovascular Disease

## 2021-07-09 VITALS — BP 128/82 | HR 74 | Ht 62.0 in | Wt 150.0 lb

## 2021-07-09 DIAGNOSIS — Z720 Tobacco use: Secondary | ICD-10-CM | POA: Diagnosis not present

## 2021-07-09 DIAGNOSIS — E7800 Pure hypercholesterolemia, unspecified: Secondary | ICD-10-CM

## 2021-07-09 DIAGNOSIS — E782 Mixed hyperlipidemia: Secondary | ICD-10-CM

## 2021-07-09 DIAGNOSIS — I2510 Atherosclerotic heart disease of native coronary artery without angina pectoris: Secondary | ICD-10-CM | POA: Diagnosis not present

## 2021-07-09 MED ORDER — ISOSORBIDE MONONITRATE ER 30 MG PO TB24: 30.0000 mg | ORAL_TABLET | Freq: Every day | ORAL | 4 refills | Status: DC

## 2021-07-09 NOTE — Patient Instructions (Signed)
Medication Instructions:  ? ?-Increase isosorbide mononitrate (Imdur) to 30mg  once daily. ? ?*If you need a refill on your cardiac medications before your next appointment, please call your pharmacy* ? ? ?Follow-Up: ?At [facility], you and your health needs are our priority.  As part of our continuing mission to provide you with exceptional heart care, we have created designated Provider Care Teams.  These Care Teams include your primary Cardiologist (physician) and Advanced Practice Providers (APPs -  Physician Assistants and Nurse Practitioners) who all work together to provide you with the care you need, when you need it. ? ?We recommend signing up for the patient portal called "MyChart".  Sign up information is provided on this After Visit Summary.  MyChart is used to connect with patients for Virtual Visits (Telemedicine).  Patients are able to view lab/test results, encounter notes, upcoming appointments, etc.  Non-urgent messages can be sent to your provider as well.   ?To learn more about what you can do with MyChart, go to [email or web address].   ? ?Your next appointment:   ?6 month(s) ? ?The format for your next appointment:   ?In Person ? ?Provider:   ?Coletta Memos, FNP, Fabian Sharp, PA-C, Sande Rives, PA-C, Caron Presume, PA-C, Jory Sims, DNP, ANP, or Almyra Deforest, PA-C     ? ?Then, Quay Burow, MD will plan to see you again in 12 month(s). ?

## 2021-07-09 NOTE — Progress Notes (Signed)
? ? ? ?07/09/2021 ?[name]   ?11-Aug-1960  ?[id number] ? ?Primary Physician Alroy Dust, L.Marlou Sa, MD ?Primary Cardiologist: Lorretta Harp MD Lupe Carney, [place] ? ?HPI:  Larry Ashley is a 61 y.o.  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  I last saw him in the office 01/13/2021.    His risk factors include 75 pack years of tobacco abuse, strong family history of heart disease with both mother and father who have had CAD in both who are patients of mine.  He is never had a heart attack or stroke.  He is developed chest pain over the last 6 to 8 months occurring once or twice a week lasting up to 30 minutes at a time.  He was recently seen in the ER 11/17/2020 with chest pain.  He ruled out for myocardial infarction.  His EKG showed no acute changes. ? ?I performed 2D echocardiography which was normal and coronary CTA on 12/09/2020 that suggested an occluded RCA.  He has had several episodes of chest pain since I initially saw him.  Based on this, we decided to proceed with diagnostic coronary angiography.  We also discussed smoking cessation. ? ? I performed cardiac catheterization on him 12/28/2020 revealing an occluded dominant RCA with left-to-right collaterals and ostial diagonal branch stenosis.  His chest pain significantly improved with the addition of low-dose long-acting oral nitrate.  I also began him on a statin drug.  ? ? Since I saw him 6 months ago he had done well until the end of January when he began smoking again and had an episode of chest pain which led to an ER visit but unfortunately he left before being evaluated.  He did have blood drawn however and apparently his enzymes were low.  He stopped smoking again on 06/11/2021 and has not had recurrent symptoms.  His lipid profile likewise after beginning  a statin drug is markedly improved. ? ? ?Current Meds  ?Medication Sig  ? atorvastatin (LIPITOR) 40 MG tablet Take 1 tablet (40 mg total) by mouth daily.  ? Multiple Vitamins-Minerals (MULTIVITAMIN WITH MINERALS) tablet Take 1 tablet by mouth daily. men  ? NAPROXEN PO Take 220 mg by mouth daily as needed (Headache).  ? Omega-3 Fatty Acids (OMEGA-3 PO) Take 3,600 mg by mouth daily.  ? [DISCONTINUED] isosorbide mononitrate (IMDUR) 30 MG 24 hr tablet Take 0.5 tablets (15 mg total) by mouth daily.  ?  ? ?No Known Allergies ? ?Social History  ? ?Socioeconomic History  ? Marital status: Married  ?  Spouse name: Not on file  ? Number of children: Not on file  ? Years of education: Not on file  ? Highest education level: Not on file  ?Occupational History  ? Not on file  ?Tobacco Use  ? Smoking status: Every Day  ?  Packs/day: 1.00  ?  Years: 49.00  ?  Pack years: 49.00  ?  Types: Cigarettes  ? Smokeless tobacco: Never  ?Substance and Sexual Activity  ? Alcohol use: Not on file  ? Drug use: Not on file  ? Sexual activity: Not on file  ?Other Topics Concern  ? Not on file  ?Social History Narrative  ? Not on file  ? ?Social Determinants of Health  ? ?Financial Resource Strain: Not on  file  ?Food Insecurity: Not on file  ?Transportation Needs: Not on file  ?Physical Activity: Not on file  ?Stress: Not on file  ?Social Connections: Not on file  ?Intimate Partner Violence: Not on file  ?  ? ?Review of Systems: ?General: negative for chills, fever, night sweats or weight changes.  ?Cardiovascular: negative for chest pain, dyspnea on exertion, edema, orthopnea, palpitations, paroxysmal nocturnal dyspnea or shortness of breath ?Dermatological: negative for rash ?Respiratory: negative for cough or wheezing ?Urologic: negative for hematuria ?Abdominal: negative for nausea, vomiting, diarrhea, bright red blood per rectum, melena, or hematemesis ?Neurologic: negative for visual changes, syncope, or dizziness ?All other systems  reviewed and are otherwise negative except as noted above. ? ? ? ?Blood pressure 128/82, pulse 74, height 5\' 2"  (1.575 m), weight 150 lb (68 kg).  ?General appearance: alert and no distress ?Neck: no adenopathy, no carotid bruit, no JVD, supple, symmetrical, trachea midline, and thyroid not enlarged, symmetric, no tenderness/mass/nodules ?Lungs: clear to auscultation bilaterally ?Heart: regular rate and rhythm, S1, S2 normal, no murmur, click, rub or gallop ?Extremities: extremities normal, atraumatic, no cyanosis or edema ?Pulses: 2+ and symmetric ?Skin: Skin color, texture, turgor normal. No rashes or lesions ?Neurologic: Grossly normal ? ?EKG sinus rhythm at 74 without ST or T wave changes.  I personally reviewed this EKG. ? ?ASSESSMENT AND PLAN:  ? ?Tobacco abuse ?History of discontinue tobacco abuse having smoked 75 pack years.  He went back to smoking at the end of January and had an episode of chest pain which led to an ER visit.  Unfortunately he left before being evaluated.  He did finally "give up" tobacco on 06/11/2021. ? ?Hyperlipidemia ?History of hyperlipidemia on atorvastatin 40 mg a day with recent lipid profile performed 06/28/2021 revealing total cholesterol 149, LDL 75 and HDL 59. ? ?Coronary artery disease ?History of CAD status post cardiac catheterization performed by myself 12/28/2020 revealing an occluded dominant RCA with left-to-right collaterals and moderate diagonal branch stenosis.  I began him on low-dose long-acting oral nitrate which resulted in marked improvement.  He did have an episode of chest pain back in late January after restarting smoking but has not had chest pain since.  I am going to increase his Imdur from 15 to 30 mg a day.  He currently is not on a calcium channel blocker or beta-blocker.  Should he have more chest pain we do have more room to work pharmacologically.  The end solution would be CTO intervention if he does not respond to pharmacologic  therapy. ? ? ? ? ?Lorretta Harp MD FACP,FACC,FAHA, FSCAI ?07/09/2021 ?8:27 AM ?

## 2021-07-09 NOTE — Assessment & Plan Note (Signed)
History of hyperlipidemia on atorvastatin 40 mg a day with recent lipid profile performed 06/28/2021 revealing total cholesterol 149, LDL 75 and HDL 59. ?

## 2021-07-09 NOTE — Assessment & Plan Note (Signed)
History of CAD status post cardiac catheterization performed by myself 12/28/2020 revealing an occluded dominant RCA with left-to-right collaterals and moderate diagonal branch stenosis.  I began him on low-dose long-acting oral nitrate which resulted in marked improvement.  He did have an episode of chest pain back in late January after restarting smoking but has not had chest pain since.  I am going to increase his Imdur from 15 to 30 mg a day.  He currently is not on a calcium channel blocker or beta-blocker.  Should he have more chest pain we do have more room to work pharmacologically.  The end solution would be CTO intervention if he does not respond to pharmacologic therapy. ?

## 2021-07-09 NOTE — Assessment & Plan Note (Signed)
History of discontinue tobacco abuse having smoked 75 pack years.  He went back to smoking at the end of January and had an episode of chest pain which led to an ER visit.  Unfortunately he left before being evaluated.  He did finally "give up" tobacco on 06/11/2021. ?

## 2021-11-26 ENCOUNTER — Other Ambulatory Visit: Payer: Self-pay | Admitting: Cardiovascular Disease

## 2021-11-26 DIAGNOSIS — E7800 Pure hypercholesterolemia, unspecified: Secondary | ICD-10-CM

## 2022-01-03 NOTE — Progress Notes (Unsigned)
Cardiology Clinic Note   Patient Name: Larry Ashley Date of Encounter: 01/05/2022  Primary Care Provider:  Alroy Dust, L.Marlou Sa, MD Primary Cardiologist:  None  Patient Profile    Larry Ashley 61 year old male presents to the clinic today for follow-up evaluation of his coronary artery disease and hyperlipidemia.  Past Medical History    Past Medical History:  Diagnosis Date   Coronary artery disease    IBS (irritable bowel syndrome)    Past Surgical History:  Procedure Laterality Date   LEFT HEART CATH AND CORONARY ANGIOGRAPHY N/A 12/28/2020   Procedure: LEFT HEART CATH AND CORONARY ANGIOGRAPHY;  Surgeon: Lorretta Harp, MD;  Location: [facility];  Service: Cardiovascular;  Laterality: N/A;    Allergies  No Known Allergies  History of Present Illness    Larry Ashley has a PMH of coronary artery disease, chest pain, family history of heart disease, hyperlipidemia, and tobacco abuse.  He was initially seen by cardiology after he presented to the emergency department with chest discomfort.  He was seen 01/13/2021.  He reported having developed chest pain over the past 6-8 months.  His discomfort would occur once or twice per week and last for up to 30 minutes at a time.  His echocardiogram was normal and his coronary CTA 12/09/2020 showed an occluded RCA.  He and Dr.Berry decided to proceed with coronary angiography.  Smoking cessation was encouraged.  He underwent cardiac catheterization on 12/28/2020.  That showed occluded RCA with left-to-right collaterals and ostial diagonal branch stenosis.  His chest discomfort significantly improved with long-acting nitrate therapy.  He was also placed on statin therapy.  He was seen in follow-up by Dr. Gwenlyn Found on 07/09/2021.  During that time he reported that he had started smoking again.  He had an episode of chest discomfort that led to an emergency room visit.  He left before being evaluated.  He had cardiac enzymes drawn which were  low.  He stopped smoking 06/11/2021.  He did not have recurrent symptoms.  His cholesterol improved with statin therapy. His Imdur was increased from 49mg to 30 mg.  He presents to the clinic today for follow-up evaluation states initially he did not tolerate increased Imdur well.  He stopped drinking caffeine and then increase to 30 mg of Imdur daily.  He was able to tolerate the medication after stopping caffeine.  He does continue to note occasional episodes of chest tightness.  He reports increased stress at work.  He is now a pEngineer, materialsdoing both jobs.  After working he is then checking emails.  Reports walking frequently and working 8 to 10-hour days.  We reviewed his cardiac catheterization and he expressed understanding.  We reviewed his most recent cholesterol panel.  We will continue his current medication regimen, give mindfulness stress reduction, have him increase the fiber in his diet, and plan follow-up for 6 months.  Today he denies chest pain, shortness of breath, lower extremity edema, fatigue, palpitations, melena, hematuria, hemoptysis, diaphoresis, weakness, presyncope, syncope, orthopnea, and PND.      Home Medications    Prior to Admission medications   Medication Sig Start Date End Date Taking? Authorizing Provider  atorvastatin (LIPITOR) 40 MG tablet Take 1 tablet (40 mg total) by mouth daily. 11/26/21   BLorretta Harp MD  isosorbide mononitrate (IMDUR) 30 MG 24 hr tablet Take 1 tablet (30 mg total) by mouth daily. 07/09/21   BLorretta Harp MD  Multiple Vitamins-Minerals (MULTIVITAMIN WITH MINERALS) tablet Take 1 tablet by mouth daily. men    [provider]  NAPROXEN PO Take 220 mg by mouth daily as needed (Headache).    [provider]  Omega-3 Fatty Acids (OMEGA-3 PO) Take 3,600 mg by mouth daily.    [provider]    Family History    History reviewed. No pertinent family history. has no family status information  on file.   Social History    Social History   Socioeconomic History   Marital status: Married    Spouse name: Not on file   Number of children: Not on file   Years of education: Not on file   Highest education level: Not on file  Occupational History   Not on file  Tobacco Use   Smoking status: Every Day    Packs/day: 1.00    Years: 49.00    Total pack years: 49.00    Types: Cigarettes   Smokeless tobacco: Never  Substance and Sexual Activity   Alcohol use: Not on file   Drug use: Not on file   Sexual activity: Not on file  Other Topics Concern   Not on file  Social History Narrative   Not on file   Social Determinants of Health   Financial Resource Strain: Not on file  Food Insecurity: Not on file  Transportation Needs: Not on file  Physical Activity: Not on file  Stress: Not on file  Social Connections: Not on file  Intimate Partner Violence: Not on file     Review of Systems    General:  No chills, fever, night sweats or weight changes.  Cardiovascular:  No chest pain, dyspnea on exertion, edema, orthopnea, palpitations, paroxysmal nocturnal dyspnea. Dermatological: No rash, lesions/masses Respiratory: No cough, dyspnea Urologic: No hematuria, dysuria Abdominal:   No nausea, vomiting, diarrhea, bright red blood per rectum, melena, or hematemesis Neurologic:  No visual changes, wkns, changes in mental status. All other systems reviewed and are otherwise negative except as noted above.  Physical Exam    VS:  BP 132/82   Pulse 84   Ht '5\' 2"'$  (1.575 m)   Wt 151 lb 6.4 oz (68.7 kg)   SpO2 97%   BMI 27.69 kg/m  , BMI Body mass index is 27.69 kg/m. GEN: Well nourished, well developed, in no acute distress. HEENT: normal. Neck: Supple, no JVD, carotid bruits, or masses. Cardiac: RRR, no murmurs, rubs, or gallops. No clubbing, cyanosis, edema.  Radials/DP/PT 2+ and equal bilaterally.  Respiratory:  Respirations regular and unlabored, clear to auscultation  bilaterally. GI: Soft, nontender, nondistended, BS + x 4. MS: no deformity or atrophy. Skin: warm and dry, no rash. Neuro:  Strength and sensation are intact. Psych: Normal affect.  Accessory Clinical Findings    Recent Labs: 06/09/2021: BUN 12; Creatinine, Ser 0.93; Hemoglobin 15.5; Platelets 207; Potassium 3.8; Sodium 138 01/04/2022: ALT 85   Recent Lipid Panel    Component Value Date/Time   CHOL 209 (H) 01/04/2022 1124   TRIG 155 (H) 01/04/2022 1124   HDL 72 01/04/2022 1124   CHOLHDL 2.9 01/04/2022 1124   LDLCALC 110 (H) 01/04/2022 1124         ECG personally reviewed by me today-none today.  Echocardiogram 12/16/2020  IMPRESSIONS     1. Left ventricular ejection fraction by 3D volume is 65 %. The left  ventricle has normal function. The left ventricle has no regional wall  motion abnormalities. Left ventricular diastolic parameters  were normal.   2. Right ventricular systolic function is normal. The right ventricular  size is normal. Tricuspid regurgitation signal is inadequate for assessing  PA pressure.   3. The mitral valve is normal in structure. No evidence of mitral valve  regurgitation. No evidence of mitral stenosis.   4. The aortic valve is grossly normal. Aortic valve regurgitation is  trivial. No aortic stenosis is present.   5. The inferior vena cava is normal in size with greater than 50%  respiratory variability, suggesting right atrial pressure of 3 mmHg.   Cardiac catheterization 12/28/2020   Prox RCA lesion is 100% stenosed.   Ost Cx to Prox Cx lesion is 30% stenosed.   1st Diag lesion is 80% stenosed.   Larry Ashley is a 61 y.o. male      [id number] LOCATION:  FACILITY: [facility]  PHYSICIAN: Quay Burow, M.D. March 16, 1961     DATE OF PROCEDURE:  12/28/2020  Diagnostic Dominance: Right  Intervention   Assessment & Plan   1.  Coronary artery disease-no chest pain today.  Underwent cardiac catheterization 12/28/2020 which showed occluded  RCA with collaterals left to right and ostial diagonal branch stenosis.  He was placed on long-acting nitrate therapy and statin therapy. Continue Imdur, atorvastatin, omega-3 fatty acids Heart healthy low-sodium diet-salty 6 given Increase physical activity as tolerated  Hyperlipidemia-LDL 75 on 07/02/21 Continue atorvastatin, omega-3 fatty acids Heart healthy low-sodium high-fiber diet Increase physical activity as tolerated Start fiber supplement and increase fiber in diet  Tobacco abuse-continues to refrain from smoking but is vaping.  Discussed importance of decreasing vaping and quitting altogether. Congratulated on smoking cessation  Disposition: Follow-up with Dr.Berry or me in 6-9 months.   Jossie Ng. Kamyrah Feeser NP-C     01/05/2022, 8:17 AM [address] Office [phone] Fax [phone]  Notice: This dictation was prepared with Dragon dictation along with smaller phrase technology. Any transcriptional errors that result from this process are unintentional and may not be corrected upon review.  I spent 14 minutes examining this patient, reviewing medications, and using patient centered shared decision making involving her cardiac care.  Prior to her visit I spent greater than 20 minutes reviewing her past medical history,  medications, and prior cardiac tests.

## 2022-01-04 ENCOUNTER — Other Ambulatory Visit: Payer: Self-pay

## 2022-01-04 DIAGNOSIS — E7800 Pure hypercholesterolemia, unspecified: Secondary | ICD-10-CM

## 2022-01-04 LAB — HEPATIC FUNCTION PANEL
ALT: 85 IU/L — ABNORMAL HIGH (ref 0–44)
AST: 85 IU/L — ABNORMAL HIGH (ref 0–40)
Albumin: 5.1 g/dL — ABNORMAL HIGH (ref 3.8–4.9)
Alkaline Phosphatase: 91 IU/L (ref 44–121)
Bilirubin Total: 0.8 mg/dL (ref 0.0–1.2)
Bilirubin, Direct: 0.23 mg/dL (ref 0.00–0.40)
Total Protein: 7.5 g/dL (ref 6.0–8.5)

## 2022-01-04 LAB — LIPID PANEL
Chol/HDL Ratio: 2.9 ratio (ref 0.0–5.0)
Cholesterol, Total: 209 mg/dL — ABNORMAL HIGH (ref 100–199)
HDL: 72 mg/dL (ref >39)
LDL Chol Calc (NIH): 110 mg/dL — ABNORMAL HIGH (ref 0–99)
Triglycerides: 155 mg/dL — ABNORMAL HIGH (ref 0–149)
VLDL Cholesterol Cal: 27 mg/dL (ref 5–40)

## 2022-01-05 ENCOUNTER — Ambulatory Visit: Payer: Commercial Managed Care - PPO | Attending: General Practice | Admitting: General Practice

## 2022-01-05 ENCOUNTER — Encounter: Payer: Self-pay | Admitting: General Practice

## 2022-01-05 VITALS — BP 132/82 | HR 84 | Ht 62.0 in | Wt 151.4 lb

## 2022-01-05 DIAGNOSIS — I2510 Atherosclerotic heart disease of native coronary artery without angina pectoris: Secondary | ICD-10-CM

## 2022-01-05 DIAGNOSIS — Z720 Tobacco use: Secondary | ICD-10-CM | POA: Diagnosis not present

## 2022-01-05 DIAGNOSIS — E782 Mixed hyperlipidemia: Secondary | ICD-10-CM

## 2022-01-05 NOTE — Patient Instructions (Signed)
Medication Instructions:  MAY USE BENEFIBER OR METAMUCIL   The current medical regimen is effective;  continue present plan and medications as directed. Please refer to the Current Medication list given to you today.   *If you need a refill on your cardiac medications before your next appointment, please call your pharmacy*  Lab Work:   Testing/Procedures:  NONE    NONE If you have labs (blood work) drawn today and your tests are completely normal, you will receive your results only by:  1-MyChart Message (if you have MyChart) OR  2-A paper copy in the mail.  If you have any lab test that is abnormal or we need to change your treatment, we will call you to review the results.  Special Instructions PLEASE READ AND FOLLOW INCREASED FIBER DIET ATTACHED  PLEASE READ AND FOLLOW STRESS REDUCTION TIPS ATTACHED  TAKE AND LOG YOUR BLOOD PRESSURE WEEKLY AND WHEN YOUR HAVE SYMPTOMS  Follow-Up: Your next appointment:  6 month(s) In Person with Quay Burow, MD    Please call our office 2 months in advance to schedule this appointment   At [facility], you and your health needs are our priority.  As part of our continuing mission to provide you with exceptional heart care, we have created designated Provider Care Teams.  These Care Teams include your primary Cardiologist (physician) and Advanced Practice Providers (APPs -  Physician Assistants and Nurse Practitioners) who all work together to provide you with the care you need, when you need it.  Important Information About Sugar     Mindfulness-Based Stress Reduction Mindfulness-based stress reduction (MBSR) is a program that helps people learn to practice mindfulness. Mindfulness is the practice of consciously paying attention to the present moment. MBSR focuses on developing self-awareness, which lets you respond to life stress without judgment or negative feelings. It can be learned and practiced through techniques such as education,  breathing exercises, meditation, and yoga. MBSR includes several mindfulness techniques in one program. MBSR works best when you understand the treatment, are willing to try new things, and can commit to spending time practicing what you learn. MBSR training may include learning about: How your feelings, thoughts, and reactions affect your body. New ways to respond to things that cause negative thoughts to start (triggers). How to notice your thoughts and let go of them. Practicing awareness of everyday things that you normally do without thinking. The techniques and goals of different types of meditation. What are the benefits of MBSR? MBSR can have many benefits, which include helping you to: Develop self-awareness. This means knowing and understanding yourself. Learn skills and attitudes that help you to take part in your own health care. Learn new ways to care for yourself. Be more accepting about how things are, and let things go. Be less judgmental and approach things with an open mind. Be patient with yourself and trust yourself more. MBSR has also been shown to: Reduce negative emotions, such as sadness, overwhelm, and worry. Improve memory and focus. Change how you sense and react to pain. Boost your body's ability to fight infections. Help you connect better with other people. Improve your sense of well-being. How to practice mindfulness To do a basic awareness exercise: Find a comfortable place to sit. Pay attention to the present moment. Notice your thoughts, feelings, and surroundings just as they are. Avoid judging yourself, your feelings, or your surroundings. Make note of any judgment that comes up and let it go. Your mind may wander, and  that is okay. Make note of when your thoughts drift, and return your attention to the present moment. To do basic mindfulness meditation: Find a comfortable place to sit. This may include a stable chair or a firm floor cushion. Sit  upright with your back straight. Let your arms fall next to your sides, with your hands resting on your legs. If you are sitting in a chair, rest your feet flat on the floor. If you are sitting on a cushion, cross your legs in front of you. Keep your head in a neutral position with your chin dropped slightly. Relax your jaw and rest the tip of your tongue on the roof of your mouth. Drop your gaze to the floor or close your eyes. Breathe normally and pay attention to your breath. Feel the air moving in and out of your nose. Feel your belly expanding and relaxing with each breath. Your mind may wander, and that is okay. Make note of when your thoughts drift, and return your attention to your breath. Avoid judging yourself, your feelings, or your surroundings. Make note of any judgment or feelings that come up, let them go, and bring your attention back to your breath. When you are ready, lift your gaze or open your eyes. Pay attention to how your body feels after the meditation. Follow these instructions at home:  Find a local in-person or online MBSR program. Set aside some time regularly for mindfulness practice. Practice every day if you can. Even 10 minutes of practice is helpful. Find a mindfulness practice that works best for you. This may include one or more of the following: Meditation. This involves focusing your mind on a certain thought or activity. Breathing awareness exercises. These help you to stay present by focusing on your breath. Body scan. For this practice, you lie down and pay attention to each part of your body from head to toe. You can identify tension and soreness and consciously relax parts of your body. Yoga. Yoga involves stretching and breathing, and it can improve your ability to move and be flexible. It can also help you to test your body's limits, which can help you release stress. Mindful eating. This way of eating involves focusing on the taste, texture, color, and  smell of each bite of food. This slows down eating and helps you feel full sooner. For this reason, it can be an important part of a weight loss plan. Find a podcast or recording that provides guidance for breathing awareness, body scan, or meditation exercises. You can listen to these any time when you have a free moment to rest without distractions. Follow your treatment plan as told by your health care provider. This may include taking regular medicines and making changes to your diet or lifestyle as recommended. Where to find more information You can find more information about MBSR from: Your health care provider. Community-based meditation centers or programs. Programs offered near you. Summary Mindfulness-based stress reduction (MBSR) is a program that teaches you how to consciously pay attention to the present moment. It is used to help you deal better with daily stress, feelings, and pain. MBSR focuses on developing self-awareness, which allows you to respond to life stress without judgment or negative feelings. MBSR programs may involve learning different mindfulness practices, such as breathing exercises, meditation, yoga, body scan, or mindful eating. Find a mindfulness practice that works best for you, and set aside time for it on a regular basis. This information is not intended to  replace advice given to you by your health care provider. Make sure you discuss any questions you have with your health care provider. Document Revised: 12/03/2020 Document Reviewed: 12/03/2020 Elsevier Patient Education  [place].   High-Fiber Eating Plan Fiber, also called dietary fiber, is a type of carbohydrate. It is found foods such as fruits, vegetables, whole grains, and beans. A high-fiber diet can have many health benefits. Your health care provider may recommend a high-fiber diet to help: Prevent constipation. Fiber can make your bowel movements more regular. Lower your  cholesterol. Relieve the following conditions: Inflammation of veins in the anus (hemorrhoids). Inflammation of specific areas of the digestive tract (uncomplicated diverticulosis). A problem of the large intestine, also called the colon, that sometimes causes pain and diarrhea (irritable bowel syndrome, or IBS). Prevent overeating as part of a weight-loss plan. Prevent heart disease, type 2 diabetes, and certain cancers. What are tips for following this plan? Reading food labels  Check the nutrition facts label on food products for the amount of dietary fiber. Choose foods that have 5 grams of fiber or more per serving. The goals for recommended daily fiber intake include: Men (age 62 or younger): 34-38 g. Men (over age 76): 28-34 g. Women (age 25 or younger): 25-28 g. Women (over age 54): 22-25 g. Your daily fiber goal is _____________ g. Shopping Choose whole fruits and vegetables instead of processed forms, such as apple juice or applesauce. Choose a wide variety of high-fiber foods such as avocados, lentils, oats, and kidney beans. Read the nutrition facts label of the foods you choose. Be aware of foods with added fiber. These foods often have high sugar and sodium amounts per serving. Cooking Use whole-grain flour for baking and cooking. Cook with brown rice instead of white rice. Meal planning Start the day with a breakfast that is high in fiber, such as a cereal that contains 5 g of fiber or more per serving. Eat breads and cereals that are made with whole-grain flour instead of refined flour or white flour. Eat brown rice, bulgur wheat, or millet instead of white rice. Use beans in place of meat in soups, salads, and pasta dishes. Be sure that half of the grains you eat each day are whole grains. General information You can get the recommended daily intake of dietary fiber by: Eating a variety of fruits, vegetables, grains, nuts, and beans. Taking a fiber supplement if you  are not able to take in enough fiber in your diet. It is better to get fiber through food than from a supplement. Gradually increase how much fiber you consume. If you increase your intake of dietary fiber too quickly, you may have bloating, cramping, or gas. Drink plenty of water to help you digest fiber. Choose high-fiber snacks, such as berries, raw vegetables, nuts, and popcorn. What foods should I eat? Fruits Berries. Pears. Apples. Oranges. Avocado. Prunes and raisins. Dried figs. Vegetables Sweet potatoes. Spinach. Kale. Artichokes. Cabbage. Broccoli. Cauliflower. Green peas. Carrots. Squash. Grains Whole-grain breads. Multigrain cereal. Oats and oatmeal. Brown rice. Barley. Bulgur wheat. [place]. Quinoa. Bran muffins. Popcorn. Rye wafer crackers. Meats and other proteins Navy beans, kidney beans, and pinto beans. Soybeans. Split peas. Lentils. Nuts and seeds. Dairy Fiber-fortified yogurt. Beverages Fiber-fortified soy milk. Fiber-fortified orange juice. Other foods Fiber bars. The items listed above may not be a complete list of recommended foods and beverages. Contact a dietitian for more information. What foods should I avoid? Fruits Fruit juice. Cooked, strained fruit.  Vegetables Fried potatoes. Canned vegetables. Well-cooked vegetables. Grains White bread. Pasta made with refined flour. White rice. Meats and other proteins Fatty cuts of meat. Fried chicken or fried fish. Dairy Milk. Yogurt. Cream cheese. Sour cream. Fats and oils Butters. Beverages Soft drinks. Other foods Cakes and pastries. The items listed above may not be a complete list of foods and beverages to avoid. Talk with your dietitian about what choices are best for you. Summary Fiber is a type of carbohydrate. It is found in foods such as fruits, vegetables, whole grains, and beans. A high-fiber diet has many benefits. It can help to prevent constipation, lower blood cholesterol, aid weight loss, and  reduce your risk of heart disease, diabetes, and certain cancers. Increase your intake of fiber gradually. Increasing fiber too quickly may cause cramping, bloating, and gas. Drink plenty of water while you increase the amount of fiber you consume. The best sources of fiber include whole fruits and vegetables, whole grains, nuts, seeds, and beans. This information is not intended to replace advice given to you by your health care provider. Make sure you discuss any questions you have with your health care provider. Document Revised: 08/29/2019 Document Reviewed: 08/29/2019 Elsevier Patient Education  [place].

## 2022-03-11 ENCOUNTER — Other Ambulatory Visit: Payer: Self-pay | Admitting: Cardiovascular Disease

## 2022-03-11 DIAGNOSIS — E7800 Pure hypercholesterolemia, unspecified: Secondary | ICD-10-CM

## 2022-04-06 ENCOUNTER — Other Ambulatory Visit: Payer: Self-pay | Admitting: Cardiovascular Disease

## 2022-04-06 DIAGNOSIS — E7800 Pure hypercholesterolemia, unspecified: Secondary | ICD-10-CM

## 2022-04-12 ENCOUNTER — Telehealth: Payer: Self-pay | Admitting: Cardiovascular Disease

## 2022-04-12 DIAGNOSIS — E7800 Pure hypercholesterolemia, unspecified: Secondary | ICD-10-CM

## 2022-04-12 NOTE — Telephone Encounter (Signed)
*  STAT* If patient is at the pharmacy, call can be transferred to refill team.   1. Which medications need to be refilled? (please list name of each medication and dose if known)           atorvastatin (LIPITOR) 40 MG tablet    2. Which pharmacy/location (including street and city if local pharmacy) is medication to be sent to?  [facility] ([place]), [place] - [address]   3. Do they need a 30 day or 90 day supply?  90 day

## 2022-04-13 MED ORDER — ATORVASTATIN CALCIUM 40 MG PO TABS: ORAL_TABLET | ORAL | 6 refills | Status: DC

## 2022-07-04 ENCOUNTER — Other Ambulatory Visit: Payer: Self-pay

## 2022-07-04 DIAGNOSIS — E7800 Pure hypercholesterolemia, unspecified: Secondary | ICD-10-CM

## 2022-07-05 ENCOUNTER — Encounter: Payer: Self-pay | Admitting: Cardiovascular Disease

## 2022-07-05 ENCOUNTER — Telehealth: Payer: Self-pay | Admitting: Licensed Clinical Social Worker

## 2022-07-05 ENCOUNTER — Ambulatory Visit: Payer: Commercial Managed Care - PPO | Attending: Cardiovascular Disease | Admitting: Cardiovascular Disease

## 2022-07-05 VITALS — BP 146/84 | HR 95 | Ht 62.0 in | Wt 134.4 lb

## 2022-07-05 DIAGNOSIS — E782 Mixed hyperlipidemia: Secondary | ICD-10-CM

## 2022-07-05 DIAGNOSIS — Z720 Tobacco use: Secondary | ICD-10-CM

## 2022-07-05 DIAGNOSIS — I2510 Atherosclerotic heart disease of native coronary artery without angina pectoris: Secondary | ICD-10-CM

## 2022-07-05 LAB — LIPID PANEL
Chol/HDL Ratio: 2.1 ratio (ref 0.0–5.0)
Cholesterol, Total: 172 mg/dL (ref 100–199)
HDL: 83 mg/dL (ref >39)
LDL Chol Calc (NIH): 74 mg/dL (ref 0–99)
Triglycerides: 80 mg/dL (ref 0–149)
VLDL Cholesterol Cal: 15 mg/dL (ref 5–40)

## 2022-07-05 LAB — HEPATIC FUNCTION PANEL
ALT: 58 IU/L — ABNORMAL HIGH (ref 0–44)
AST: 177 IU/L — ABNORMAL HIGH (ref 0–40)
Albumin: 4.5 g/dL (ref 3.8–4.9)
Alkaline Phosphatase: 111 IU/L (ref 44–121)
Bilirubin Total: 0.8 mg/dL (ref 0.0–1.2)
Bilirubin, Direct: 0.32 mg/dL (ref 0.00–0.40)
Total Protein: 7.4 g/dL (ref 6.0–8.5)

## 2022-07-05 MED ORDER — ASPIRIN 81 MG PO TBEC: 81.0000 mg | DELAYED_RELEASE_TABLET | Freq: Every day | ORAL | Status: AC

## 2022-07-05 NOTE — Assessment & Plan Note (Signed)
History of CAD status post diagnostic cardiac catheterization performed by myself 12/28/2020 revealing an occluded dominant RCA with left-to-right collaterals from the circumflex and a 80% ostial first diagonal branch stenosis.  He had normal LV function.  He is on Imdur 30 mg a day and denies chest pain.

## 2022-07-05 NOTE — Progress Notes (Signed)
Heart and Vascular Care Navigation  07/05/2022  Larry Ashley 10-Feb-1961 [id number]  Reason for Referral: ETOH Cessation resources Patient is participating in a Managed Medicaid Plan: No, UHC commercial insurance only.   Engaged with patient by telephone for initial visit for Heart and Vascular Care Coordination.                                                                                                   Assessment:            LCSW was able to reach pt this afternoon at [phone]. Introduced self, role, reason for call. Pt requests that he put the call on speakerphone so he and his wife can both hear. His wife Larry Ashley joined the call, they confirm home address, PCP (we discussed that he has not seen Dr. Alroy Dust recently, I encouraged him to see PCP as to not roll off the books and that I believe Dr. Alroy Dust is retiring) and Larry Ashley's listed number. 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 He shares that he has been drinking about a gallon a week for 20 years. He has stopped smoking but still uses vape.   He finds more concern with his drinking than his smoking. He hopes to quit completely- is concerned about physical effects of quitting ETOH that he has read about online and is very adamant that he cannot do inpatient treatment and does not want to use medication for assistance with cessation. LCSW thanked pt for his willingness to seek treatment and quit, I encouraged pt to speak with programs that I will send through about each of their philosophies of treatment and how it may work with his job and schedule. I shared that when quitting ETOH there are safety and health concerns that are relevant and therefore there may need to be some flexibility on pt's end to ensure treatment meets his needs and is safe.  We discussed that there is often a cost for treatment, there may be some benefits through his insurance plan and if he does want to see if they are able to cover any costs/if  he has any benefits then he should call his insurance company member services and speak with them about his options. If he does not have any benefits through his insurance then he may want to speak with programs about assistance/sliding scale coverage if needed.   Pt and pt wife denied any additional concerns regarding cost/accessibility of medications or food. They have transportation and no issues with their housing or utility costs at this time. LCSW encouraged them to let us know if any of these do become an issue and we can connect them with resources. No additional questions at this time.                        HRT/VAS Care Coordination     Patients Home Cardiology Office [place] Team Social Worker   Social Worker Name: Larry Ashley, Larry Ashley, Larry Ashley   Living arrangements for the past 2 months  Single Family Home   Lives with: Spouse   Patient Current Librarian, academic   Patient Has Concern With Paying Medical Bills No   Does Patient Have Prescription Coverage? Yes       Social History:                                                                             [place]: No Food Insecurity (07/05/2022)  Housing: Low Risk  (07/05/2022)  Transportation Needs: No Transportation Needs (07/05/2022)  Utilities: Not At Risk (07/05/2022)  Financial Resource Strain: Low Risk  (07/05/2022)  Tobacco Use: High Risk (07/05/2022)    SDOH Interventions: Financial Resources:  Financial Strain Interventions: Intervention Not Indicated- discussed speaking with insurance member services to discuss coverage and cost/benefits should pt desire to utilize his insurance to access services  Food Insecurity:  Food Insecurity Interventions: Intervention Not Indicated  Housing Insecurity:  Housing Interventions: Intervention Not Indicated  Transportation:   Transportation Interventions: Intervention Not Indicated, Patient Resources  (Friends/Family)    Other Care Navigation Interventions:     Inpatient/Outpatient Substance Abuse Counseling/Rehab Options Outpatient Programs   [facility] [place], [place] [address]  [phone] Private insurance, Medicare A&B, and [place] (Alcohol and Drug Services)  [address]  [place], [place] [address] [phone] Medicaid, [place]    [address] [place]  [place], [place], [place]  IOP and Partial Hospitalization Program  [place].  [place], [place] [address]  [phone] Private Insurance, Medicaid-[place] and [place] only for partial   [facility]  [place].  [place], [place] Private Insurance and Self Pay   [facility] [phone]  [place], [place], and Self Pay.   Medical laboratory scientific officer (Partial Hospitalization) [place], [place] [address] [phone] Private insurance, some Medicaid  [facility]  [place], [place] [address]  Private Insurance, [place], and Self Pay   [place]:  [facility] [address] [place], [place], [place] [address] [phone] [place], [place] [address] Phone: [phone] or [phone] [facility] [address] [place], [place] [address] [phone] Medicaid, private insurance  [facility] [place]    [address]    [place], [place] [address]  [phone] or [place] [phone]  Self Pay only, sliding scale  [facility]  [address]  [place], [place] [address] [phone] State funds for uninsured   [place].  [place], [place] Private Insurance, and Self pay   Provided Pharmacy assistance resources  Pt denies any issues obtaining or affording medications at this time.   Patient expressed Mental Health concerns Yes, Referred to:  substance use cessation programs for substance use challenges   Follow-up plan:   LCSW mailed pt the above resources, my card,  and information about [employer] of the [place] resources. LCSW encouraged pt to let me know if he does not receive these resources by next Wednesday and we can connect him with them another way.

## 2022-07-05 NOTE — Patient Instructions (Signed)
Medication Instructions:  Your physician has recommended you make the following change in your medication:   -Start taking aspirin EC '81mg'$ .   *If you need a refill on your cardiac medications before your next appointment, please call your pharmacy*   Follow-Up: At [facility], you and your health needs are our priority.  As part of our continuing mission to provide you with exceptional heart care, we have created designated Provider Care Teams.  These Care Teams include your primary Cardiologist (physician) and Advanced Practice Providers (APPs -  Physician Assistants and Nurse Practitioners) who all work together to provide you with the care you need, when you need it.  We recommend signing up for the patient portal called "MyChart".  Sign up information is provided on this After Visit Summary.  MyChart is used to connect with patients for Virtual Visits (Telemedicine).  Patients are able to view lab/test results, encounter notes, upcoming appointments, etc.  Non-urgent messages can be sent to your provider as well.   To learn more about what you can do with MyChart, go to [email or web address].    Your next appointment:   6 month(s)  Provider:   Coletta Memos, FNP      Then, Quay Burow, MD will plan to see you again in 12 month(s).   Other Instructions Referral made to our [place] a healthy diet is important for the health of your heart. A heart-healthy eating plan includes: Eating less unhealthy fats. Eating more healthy fats. Eating less salt in your food. Salt is also called sodium. Making other changes in your diet. Talk with your doctor or a diet specialist (dietitian) to create an eating plan that is right for you. What are tips for following this plan? Cooking Avoid frying your food. Try to bake, boil, grill, or broil it instead. You can also reduce fat by: Removing the skin from poultry. Removing all visible  fats from meats. Steaming vegetables in water or broth. Meal planning  At meals, divide your plate into four equal parts: Fill one-half of your plate with vegetables and green salads. Fill one-fourth of your plate with whole grains. Fill one-fourth of your plate with lean protein foods. Eat 2-4 cups of vegetables per day. One cup of vegetables is: 1 cup (91 g) broccoli or cauliflower florets. 2 medium carrots. 1 large bell pepper. 1 large sweet potato. 1 large tomato. 1 medium white potato. 2 cups (150 g) raw leafy greens. Eat 1-2 cups of fruit per day. One cup of fruit is: 1 small apple 1 large banana 1 cup (237 g) mixed fruit, 1 large orange,  cup (82 g) dried fruit, 1 cup (240 mL) 100% fruit juice. Eat more foods that have soluble fiber. These are apples, broccoli, carrots, beans, peas, and barley. Try to get 20-30 g of fiber per day. Eat 4-5 servings of nuts, legumes, and seeds per week: 1 serving of dried beans or legumes equals  cup (90 g) cooked. 1 serving of nuts is  oz (12 almonds, 24 pistachios, or 7 walnut halves). 1 serving of seeds equals  oz (8 g). General information Eat more home-cooked food. Eat less restaurant, buffet, and fast food. Limit or avoid alcohol. Limit foods that are high in starch and sugar. Avoid fried foods. Lose weight if you are overweight. Keep track of how much salt (sodium) you eat. This is important if you have high blood pressure. Ask your doctor to tell you more  about this. Try to add vegetarian meals each week. Fats Choose healthy fats. These include olive oil and canola oil, flaxseeds, walnuts, almonds, and seeds. Eat more omega-3 fats. These include salmon, mackerel, sardines, tuna, flaxseed oil, and ground flaxseeds. Try to eat fish at least 2 times each week. Check food labels. Avoid foods with trans fats or high amounts of saturated fat. Limit saturated fats. These are often found in animal products, such as meats, butter,  and cream. These are also found in plant foods, such as palm oil, palm kernel oil, and coconut oil. Avoid foods with partially hydrogenated oils in them. These have trans fats. Examples are stick margarine, some tub margarines, cookies, crackers, and other baked goods. What foods should I eat? Fruits All fresh, canned (in natural juice), or frozen fruits. Vegetables Fresh or frozen vegetables (raw, steamed, roasted, or grilled). Green salads. Grains Most grains. Choose whole wheat and whole grains most of the time. Rice and pasta, including brown rice and pastas made with whole wheat. Meats and other proteins Lean, well-trimmed beef, veal, pork, and lamb. Chicken and [place] without skin. All fish and shellfish. Wild duck, rabbit, pheasant, and venison. Egg whites or low-cholesterol egg substitutes. Dried beans, peas, lentils, and tofu. Seeds and most nuts. Dairy Low-fat or nonfat cheeses, including ricotta and mozzarella. Skim or 1% milk that is liquid, powdered, or evaporated. Buttermilk that is made with low-fat milk. Nonfat or low-fat yogurt. Fats and oils Non-hydrogenated (trans-free) margarines. Vegetable oils, including soybean, sesame, sunflower, olive, peanut, safflower, corn, canola, and cottonseed. Salad dressings or mayonnaise made with a vegetable oil. Beverages Mineral water. Coffee and tea. Diet carbonated beverages. Sweets and desserts Sherbet, gelatin, and fruit ice. Small amounts of dark chocolate. Limit all sweets and desserts. Seasonings and condiments All seasonings and condiments. The items listed above may not be a complete list of foods and drinks you can eat. Contact a dietitian for more options. What foods should I avoid? Fruits Canned fruit in heavy syrup. Fruit in cream or butter sauce. Fried fruit. Limit coconut. Vegetables Vegetables cooked in cheese, cream, or butter sauce. Fried vegetables. Grains Breads that are made with saturated or trans fats, oils, or  whole milk. Croissants. Sweet rolls. Donuts. High-fat crackers, such as cheese crackers. Meats and other proteins Fatty meats, such as hot dogs, ribs, sausage, bacon, rib-eye roast or steak. High-fat deli meats, such as salami and bologna. Caviar. Domestic duck and goose. Organ meats, such as liver. Dairy Cream, sour cream, cream cheese, and creamed cottage cheese. Whole-milk cheeses. Whole or 2% milk that is liquid, evaporated, or condensed. Whole buttermilk. Cream sauce or high-fat cheese sauce. Yogurt that is made from whole milk. Fats and oils Meat fat, or shortening. Cocoa butter, hydrogenated oils, palm oil, coconut oil, palm kernel oil. Solid fats and shortenings, including bacon fat, salt pork, lard, and butter. Nondairy cream substitutes. Salad dressings with cheese or sour cream. Beverages Regular sodas and juice drinks with added sugar. Sweets and desserts Frosting. Pudding. Cookies. Cakes. Pies. Milk chocolate or white chocolate. Buttered syrups. Full-fat ice cream or ice cream drinks. The items listed above may not be a complete list of foods and drinks to avoid. Contact a dietitian for more information. Summary Heart-healthy meal planning includes eating less unhealthy fats, eating more healthy fats, and making other changes in your diet. Eat a balanced diet. This includes fruits and vegetables, low-fat or nonfat dairy, lean protein, nuts and legumes, whole grains, and heart-healthy oils and fats. This  information is not intended to replace advice given to you by your health care provider. Make sure you discuss any questions you have with your health care provider. Document Revised: 05/31/2021 Document Reviewed: 05/31/2021 Elsevier Patient Education  [place].

## 2022-07-05 NOTE — Progress Notes (Signed)
07/05/2022 Larry Ashley   03-18-1961  [id number]  Primary Physician Alroy Dust, L.Marlou Sa, MD Primary Cardiologist: Lorretta Harp MD [place], [place]  HPI:  Larry Ashley is a 62 y.o.  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  I last saw him in the office 07/10/2018.    His risk factors include 75 pack years of tobacco abuse, strong family history of heart disease with both mother and father who have had CAD in both who are patients of mine.  He is never had a heart attack or stroke.  He is developed chest pain over the last 6 to 8 months occurring once or twice a week lasting up to 30 minutes at a time.  He was recently seen in the ER 11/17/2020 with chest pain.  He ruled out for myocardial infarction.  His EKG showed no acute changes.  I performed 2D echocardiography which was normal and coronary CTA on 12/09/2020 that suggested an occluded RCA.  He has had several episodes of chest pain since I initially saw him.  Based on this, we decided to proceed with diagnostic coronary angiography.  We also discussed smoking cessation.   I performed cardiac catheterization on him 12/28/2020 revealing an occluded dominant RCA with left-to-right collaterals and ostial diagonal branch stenosis.  His chest pain significantly improved with the addition of low-dose long-acting oral nitrate.  I also began him on a statin drug.     Since I saw him in the office a year ago he continues to do well.  He is lost 16 pounds as a result of diet.  He stopped smoking at the time of cardiac catheterization but thinks to vape.  He denies chest pain or shortness of breath.  He still drinks "a gallon of alcohol" a week and wishes to stop that as well.  Current Meds  Medication Sig   atorvastatin (LIPITOR) 40 MG tablet TAKE 1  TABLET BY MOUTH ONCE DAILY .   isosorbide mononitrate (IMDUR) 30 MG 24 hr tablet Take 1 tablet (30 mg total) by mouth daily.   Multiple Vitamins-Minerals (MULTIVITAMIN WITH MINERALS) tablet Take 1 tablet by mouth daily. men   NAPROXEN PO Take 220 mg by mouth daily as needed (Headache).   Omega-3 Fatty Acids (OMEGA-3 PO) Take 3,600 mg by mouth daily.     No Known Allergies  Social History   Socioeconomic History   Marital status: Married    Spouse name: Not on file   Number of children: Not on file   Years of education: Not on file   Highest education level: Not on file  Occupational History   Not on file  Tobacco Use   Smoking status: Every Day    Packs/day: 1.00    Years: 49.00    Total pack years: 49.00    Types: Cigarettes   Smokeless tobacco: Never  Substance and Sexual Activity   Alcohol use: Not on file   Drug use: Not on file   Sexual activity: Not on file  Other Topics Concern   Not on file  Social History Narrative   Not on file   Social Determinants of Health   Financial Resource Strain: Not on file  Food Insecurity: Not on file  Transportation Needs: Not on file  Physical  Activity: Not on file  Stress: Not on file  Social Connections: Not on file  Intimate Partner Violence: Not on file     Review of Systems: General: negative for chills, fever, night sweats or weight changes.  Cardiovascular: negative for chest pain, dyspnea on exertion, edema, orthopnea, palpitations, paroxysmal nocturnal dyspnea or shortness of breath Dermatological: negative for rash Respiratory: negative for cough or wheezing Urologic: negative for hematuria Abdominal: negative for nausea, vomiting, diarrhea, bright red blood per rectum, melena, or hematemesis Neurologic: negative for visual changes, syncope, or dizziness All other systems reviewed and are otherwise negative except as noted above.    Blood pressure (!) 146/84, pulse 95, height '5\' 2"'$  (1.575 m), weight 134 lb 6.4  oz (61 kg), SpO2 95 %.  General appearance: alert and no distress Neck: no adenopathy, no carotid bruit, no JVD, supple, symmetrical, trachea midline, and thyroid not enlarged, symmetric, no tenderness/mass/nodules Lungs: clear to auscultation bilaterally Heart: regular rate and rhythm, S1, S2 normal, no murmur, click, rub or gallop Extremities: extremities normal, atraumatic, no cyanosis or edema Pulses: 2+ and symmetric Skin: Skin color, texture, turgor normal. No rashes or lesions Neurologic: Grossly normal  EKG sinus rhythm at 95 with small inferior Q waves and poor R wave progression.  I personally reviewed this EKG.   ASSESSMENT AND PLAN:   Tobacco abuse Long history tobacco abuse (75 pack years), discontinued at the time of his cardiac catheterization.  He does still vapes however.  Hyperlipidemia History of hyperlipidemia on statin therapy with recent lipid profile performed 2/26 was 24 revealing total cholesterol 172, LDL 74 and HDL of 80, markedly improved compared to his previous lipid profile performed 01/04/2022, as a result of atorvastatin and diet.  Coronary artery disease History of CAD status post diagnostic cardiac catheterization performed by myself 12/28/2020 revealing an occluded dominant RCA with left-to-right collaterals from the circumflex and a 80% ostial first diagonal branch stenosis.  He had normal LV function.  He is on Imdur 30 mg a day and denies chest pain.     Lorretta Harp MD FACP,FACC,FAHA, [facility] 07/05/2022 8:21 AM

## 2022-07-05 NOTE — Assessment & Plan Note (Signed)
Long history tobacco abuse (75 pack years), discontinued at the time of his cardiac catheterization.  He does still vapes however.

## 2022-07-05 NOTE — Assessment & Plan Note (Signed)
History of hyperlipidemia on statin therapy with recent lipid profile performed 2/26 was 24 revealing total cholesterol 172, LDL 74 and HDL of 80, markedly improved compared to his previous lipid profile performed 01/04/2022, as a result of atorvastatin and diet.

## 2022-07-12 ENCOUNTER — Telehealth: Payer: Self-pay | Admitting: Licensed Clinical Social Worker

## 2022-07-12 NOTE — Telephone Encounter (Signed)
H&V Care Navigation CSW Progress Note  Clinical Social Worker contacted patient by phone to f/u on cessation resources sent to pt. Was able to touch base with him this afternoon at [phone]- he shares he is not sure if he has received them yet as he is at work. He will check and if by tomorrow they have not received it in the mail he should contact me. If I do not hear from him then I will reach out before the end of the week.   Patient is participating in a Managed Medicaid Plan:  No, UHC commercial only  [place]: No Food Insecurity (07/05/2022)  Housing: Low Risk  (07/05/2022)  Transportation Needs: No Transportation Needs (07/05/2022)  Utilities: Not At Risk (07/05/2022)  Financial Resource Strain: Low Risk  (07/05/2022)  Tobacco Use: High Risk (07/05/2022)    [name], MSW, [place]  [phone]- work cell phone (preferred) [phone]- desk phone

## 2022-07-18 ENCOUNTER — Telehealth: Payer: Self-pay | Admitting: Licensed Clinical Social Worker

## 2022-07-18 NOTE — Telephone Encounter (Signed)
H&V Care Navigation CSW Progress Note  Clinical Social Worker contacted patient by phone to f/u on cessation resources sent. No answer this morning and voicemail currently full. Will re-attempt as able.   Patient is participating in a Managed Medicaid Plan:  No, UHC commercial coverage only.   SDOH Screenings   Food Insecurity: No Food Insecurity (07/05/2022)  Housing: Low Risk  (07/05/2022)  Transportation Needs: No Transportation Needs (07/05/2022)  Utilities: Not At Risk (07/05/2022)  Financial Resource Strain: Low Risk  (07/05/2022)  Tobacco Use: High Risk (07/05/2022)    Larry Ashley, MSW, [place]  [phone]- work cell phone (preferred) [phone]- desk phone

## 2022-07-19 ENCOUNTER — Telehealth: Payer: Self-pay | Admitting: Licensed Clinical Social Worker

## 2022-07-19 NOTE — Telephone Encounter (Signed)
H&V Care Navigation CSW Progress Note  Clinical Social Worker contacted patient by phone to f/u on resources. Pt shares that he hasn't received anything yet mailed by this Probation officer. Confirmed home address and that it still hasn't been returned to [place]. Shared hopefully it will still come, but also received permission to email it to pt at [email or web address]. Pt asked if he and his wife could come in and meet with me, I offered to do so and go over the resources. I further clarified that I personally do not have my LCAS (substance use certificate)- that we do not provide ETOH counseling directly with our [facility] offices as it usually needs to be supervised intially and with ongoing case management. I shared that likely some of the confusion arose from the referral being made to [facility], but shared they also do not have clinical license that provides ETOH counseling. I clarified the best thing to do is call his [facility] plan and see what his benefits are for treatment if anything, they also may be able to provide him with a list of in network providers for cessation. From that he and his wife can reach out to see what program philosophy meets what he needs. He continues to talk about how googling ETOH cessation makes him nervous and I shared that these programs could perhaps help him with calming some of these concerns. I also asked if me calling his wife and clarifying the above might also be helpful. He is agreeable. No answer today at [phone], left voicemail requesting return call; emailed resources to pt.   Patient is participating in a Managed Medicaid Plan:  No, UHC commercial insurance only.   SDOH Screenings   Food Insecurity: No Food Insecurity (07/05/2022)  Housing: Low Risk  (07/05/2022)  Transportation Needs: No Transportation Needs (07/05/2022)  Utilities: Not At Risk (07/05/2022)  Financial Resource Strain: Low Risk  (07/05/2022)  Tobacco Use: High Risk (07/05/2022)    [name],  MSW, [place]  [phone]- work cell phone (preferred) [phone]- desk phone

## 2022-07-20 NOTE — Telephone Encounter (Signed)
H&V Care Navigation CSW Progress Note  Clinical Social Worker  received call from pt spouse [name] (DPR on file)  to return my call from yesterday. Pt wife and I discussed call yesterday, resources, how to utilize and I was able to forward them to her email [email or web address]. She is committed to assisting pt with looking at options and connecting with a program. Encouraged her to contact me as needed as they go through their process.   Patient is participating in a Managed Medicaid Plan:  No, [place]: No Food Insecurity (07/05/2022)  Housing: Low Risk  (07/05/2022)  Transportation Needs: No Transportation Needs (07/05/2022)  Utilities: Not At Risk (07/05/2022)  Financial Resource Strain: Low Risk  (07/05/2022)  Tobacco Use: High Risk (07/05/2022)   [name], MSW, [place]  [phone]- work cell phone (preferred) [phone]- desk phone

## 2022-07-22 ENCOUNTER — Other Ambulatory Visit: Payer: Self-pay | Admitting: Cardiovascular Disease

## 2022-07-22 ENCOUNTER — Telehealth: Payer: Self-pay | Admitting: Cardiovascular Disease

## 2022-07-22 NOTE — Telephone Encounter (Signed)
Pt c/o medication issue:  1. Name of Medication: atorvastatin (LIPITOR) 40 MG tablet   2. How are you currently taking this medication (dosage and times per day)?   3. Are you having a reaction (difficulty breathing--STAT)?   4. What is your medication issue?  Patient states the pharmacy will not refill this med for him and he is not sure why. Requesting call back.

## 2022-07-22 NOTE — Telephone Encounter (Signed)
Called pt and he states that his issue has been resolved, the pharmacy filled his prescription

## 2022-08-03 ENCOUNTER — Telehealth: Payer: Self-pay | Admitting: Licensed Clinical Social Worker

## 2022-08-03 NOTE — Telephone Encounter (Signed)
H&V Care Navigation CSW Progress Note  Clinical Social Worker  received return call from pt  to f/u on missed call. Pt shares he and wife received resources and are going to look through them. No additional questions at this time. Encouraged them to reach out as needed with any questions or if any additional resources needed.   Patient is participating in a Managed Medicaid Plan:  No, commercial [employer].   SDOH Screenings   Food Insecurity: No Food Insecurity (07/05/2022)  Housing: Low Risk  (07/05/2022)  Transportation Needs: No Transportation Needs (07/05/2022)  Utilities: Not At Risk (07/05/2022)  Financial Resource Strain: Low Risk  (07/05/2022)  Tobacco Use: High Risk (07/05/2022)   [name], MSW, [place]  [phone]- work cell phone (preferred) [phone]- desk phone

## 2022-08-03 NOTE — Telephone Encounter (Signed)
H&V Care Navigation CSW Progress Note  Clinical Social Worker contacted patient by phone to f/u on resources. No answer today at [phone]. I also attempted pt wife [name] at [phone], also no answer- left voicemail on her answering machine if I can be of any additional assistance/provide check in.   Patient is participating in a Managed Medicaid Plan:  No, [place]: No Food Insecurity (07/05/2022)  Housing: Low Risk  (07/05/2022)  Transportation Needs: No Transportation Needs (07/05/2022)  Utilities: Not At Risk (07/05/2022)  Financial Resource Strain: Low Risk  (07/05/2022)  Tobacco Use: High Risk (07/05/2022)   [name], MSW, [place]  [phone]- work cell phone (preferred) [phone]- desk phone

## 2022-08-15 ENCOUNTER — Telehealth: Payer: Self-pay | Admitting: Licensed Clinical Social Worker

## 2022-08-15 NOTE — Telephone Encounter (Addendum)
H&V Care Navigation CSW Progress Note  Clinical Social Worker  received text from pt wife (pt has given verbal permission for me to communicate with her- [phone]) on my work cell - I have been out of the office since 4/4 with no access to phone or text messages, unclear when text was sent. I texted pt wife back, apologized for any delay and let her know I was out of office. Pt wife's text had stated pt has been not eating, just drinking and sleeping. Down to 115 lbs, and she would like to take him to the hospital for detox with medical supervision given his cardiac issues.   LCSW discussed options with LCSW colleague Eileen Stanford; not knowing whether or not pt is Management consultant resident or the extent of medical monitoring at [facility], I recommended she take him to emergency department at [facility] where they can assist with medical supervision for detox.   No return response at this time.  ADDENDUM: pt wife responded "okay thank you"  Patient is participating in a Managed Medicaid Plan:  No,   SDOH Screenings   Food Insecurity: No Food Insecurity (07/05/2022)  Housing: Low Risk  (07/05/2022)  Transportation Needs: No Transportation Needs (07/05/2022)  Utilities: Not At Risk (07/05/2022)  Financial Resource Strain: Low Risk  (07/05/2022)  Tobacco Use: High Risk (07/05/2022)   Octavio Graves, MSW, LCSW Clinical Social Worker II [facility]Vascular Care Navigation  [phone]- work cell phone (preferred) [phone]- desk phone

## 2022-10-04 IMAGING — DX DG CHEST 2V
2 series · 2 of 2 positions shown · non-contrast
Comparison: 11/17/2020

CLINICAL DATA: Chest pain

EXAM:
CHEST - 2 VIEW

[chest pa]
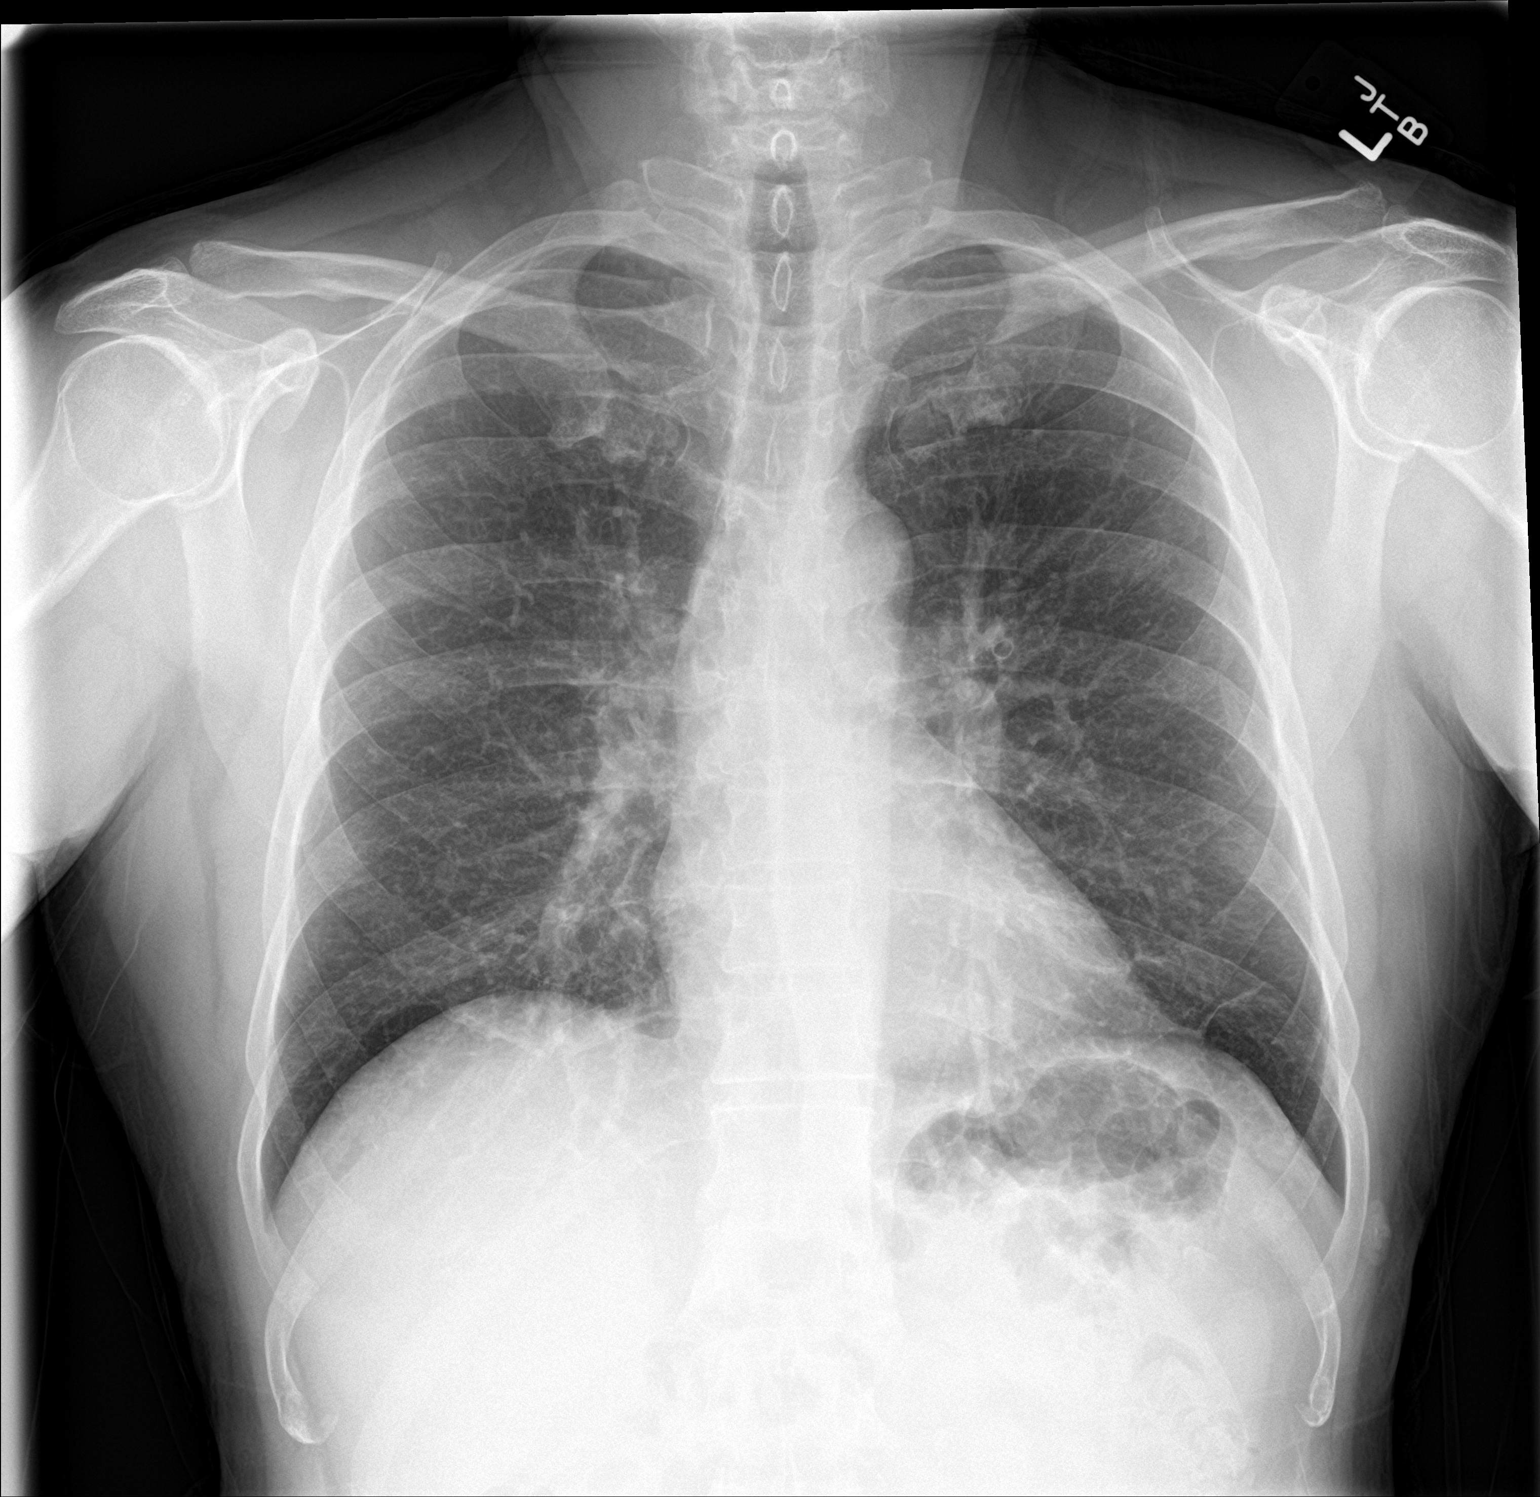

[chest lat]
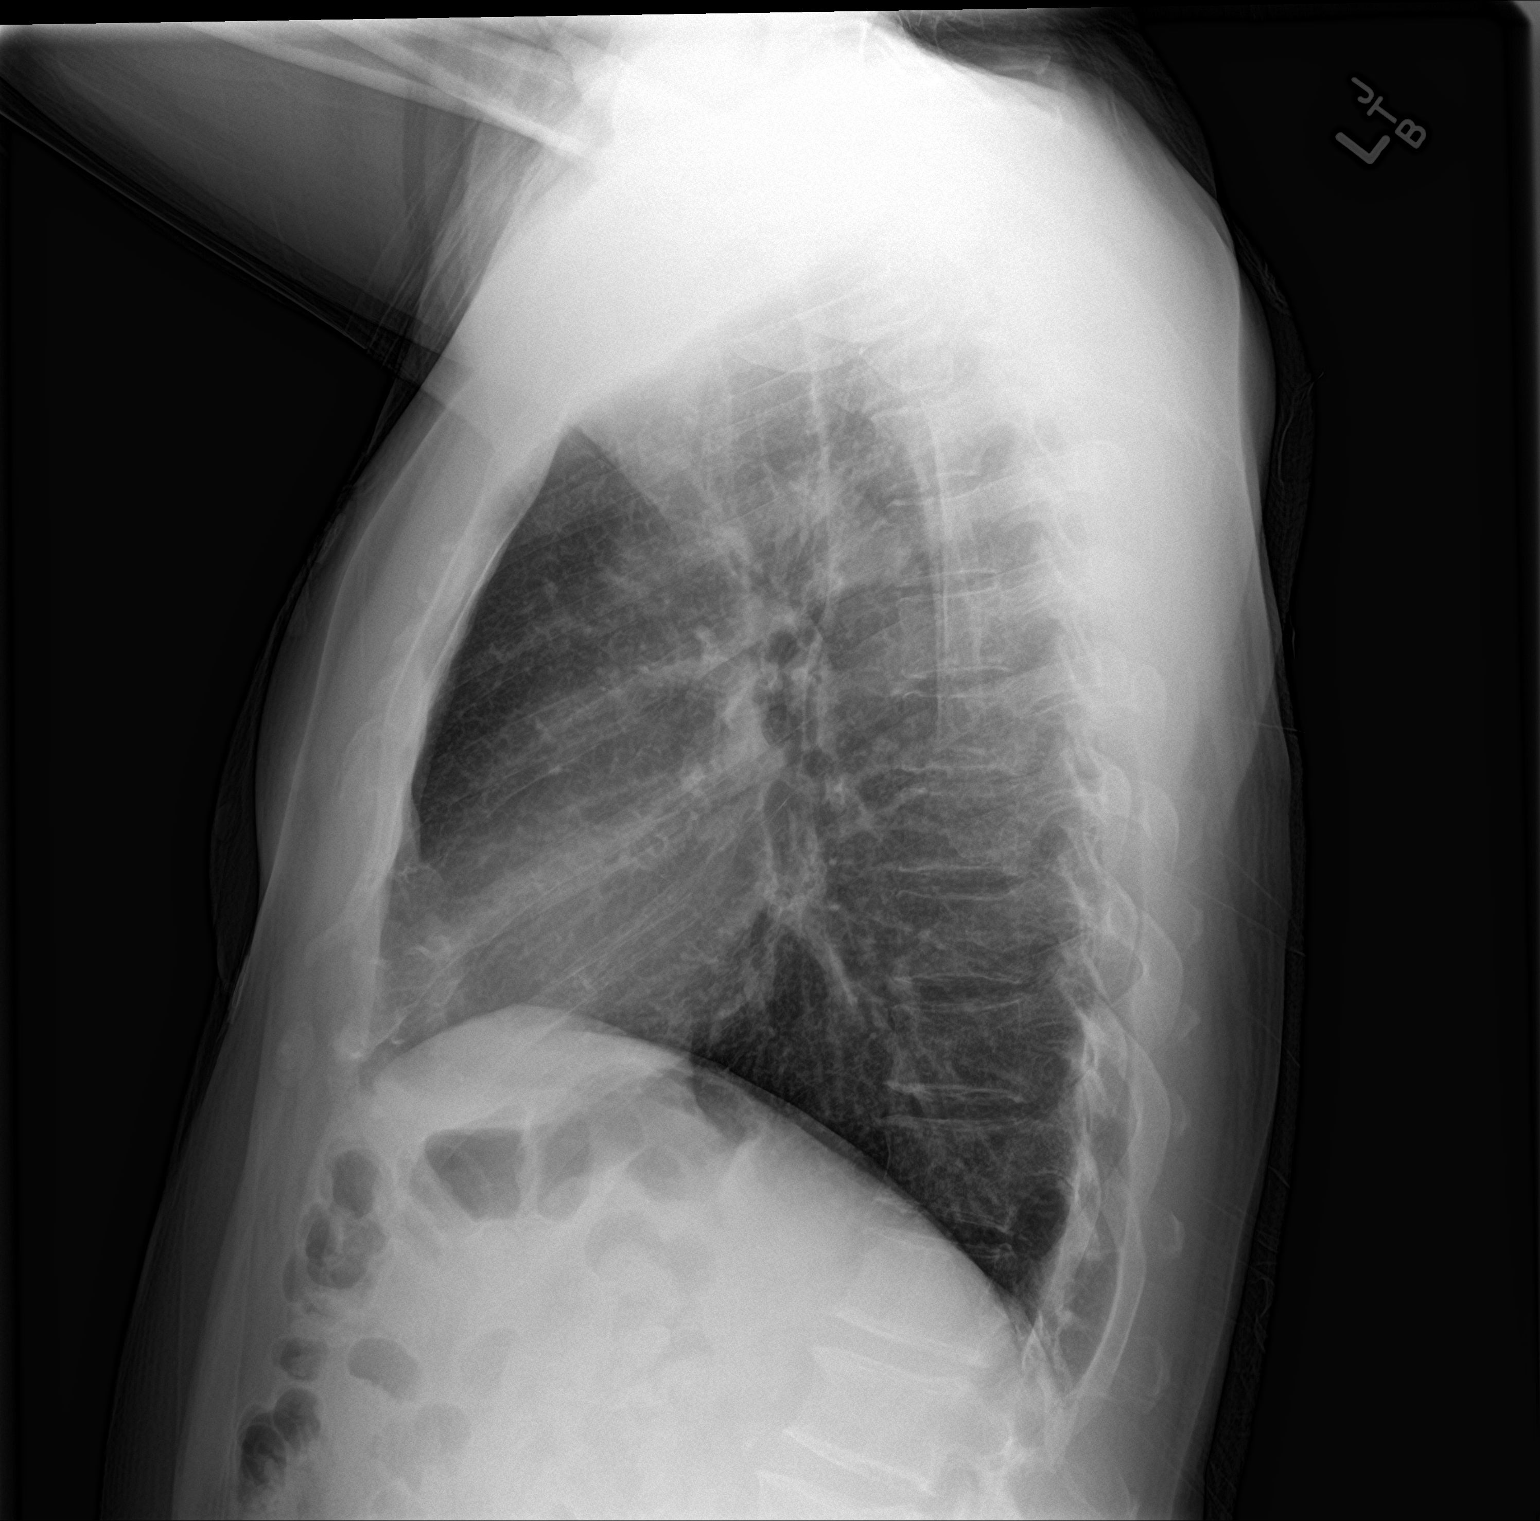

[2 of 2 positions shown; findings below may reference images not displayed]

FINDINGS: The heart size and mediastinal contours are within normal limits.
Mild chronic interstitial changes. No new consolidation. The
visualized skeletal structures are unremarkable.
IMPRESSION: No acute process in the chest.

## 2023-03-21 NOTE — Progress Notes (Deleted)
Cardiology Clinic Note   Patient Name: Larry Ashley Date of Encounter: 03/21/2023  Primary Care Provider:  Clovis Riley, L.August Saucer, MD (Inactive) Primary Cardiologist:  Nanetta Batty, MD  Patient Profile    Larry Ashley 62 year old male presents to the clinic today for follow-up evaluation of his coronary artery disease and hyperlipidemia.  Past Medical History    Past Medical History:  Diagnosis Date   Coronary artery disease    IBS (irritable bowel syndrome)    Past Surgical History:  Procedure Laterality Date   LEFT HEART CATH AND CORONARY ANGIOGRAPHY N/A 12/28/2020   Procedure: LEFT HEART CATH AND CORONARY ANGIOGRAPHY;  Surgeon: Runell Gess, MD;  Location: [facility] INVASIVE CV LAB;  Service: Cardiovascular;  Laterality: N/A;    Allergies  No Known Allergies  History of Present Illness    Larry Ashley has a PMH of coronary artery disease, chest pain, family history of heart disease, hyperlipidemia, and tobacco abuse.  He was initially seen by cardiology after he presented to the emergency department with chest discomfort.  He was seen 01/13/2021.  He reported having developed chest pain over the past 6-8 months.  His discomfort would occur once or twice per week and last for up to 30 minutes at a time.  His echocardiogram was normal and his coronary CTA 12/09/2020 showed an occluded RCA.  He and Dr.Berry decided to proceed with coronary angiography.  Smoking cessation was encouraged.  He underwent cardiac catheterization on 12/28/2020.  That showed occluded RCA with left-to-right collaterals and ostial diagonal branch stenosis.  His chest discomfort significantly improved with long-acting nitrate therapy.  He was also placed on statin therapy.  He was seen in follow-up by Dr. Allyson Sabal on 07/09/2021.  During that time he reported that he had started smoking again.  He had an episode of chest discomfort that led to an emergency room visit.  He left before being evaluated.  He had  cardiac enzymes drawn which were low.  He stopped smoking 06/11/2021.  He did not have recurrent symptoms.  His cholesterol improved with statin therapy. His Imdur was increased from 79m g to 30 mg.  He presents to the clinic today for follow-up evaluation states initially he did not tolerate increased Imdur well.  He stopped drinking caffeine and then increase to 30 mg of Imdur daily.  He was able to tolerate the medication after stopping caffeine.  He does continue to note occasional episodes of chest tightness.  He reports increased stress at work.  He is now a Neurosurgeon doing both jobs.  After working he is then checking emails.  Reports walking frequently and working 8 to 10-hour days.  We reviewed his cardiac catheterization and he expressed understanding.  We reviewed his most recent cholesterol panel.  We will continue his current medication regimen, give mindfulness stress reduction, have him increase the fiber in his diet, and plan follow-up for 6 months.  Today he denies chest pain, shortness of breath, lower extremity edema, fatigue, palpitations, melena, hematuria, hemoptysis, diaphoresis, weakness, presyncope, syncope, orthopnea, and PND.      Home Medications    Prior to Admission medications   Medication Sig Start Date End Date Taking? Authorizing Provider  atorvastatin (LIPITOR) 40 MG tablet Take 1 tablet (40 mg total) by mouth daily. 11/26/21   Runell Gess, MD  isosorbide mononitrate (IMDUR) 30 MG 24 hr tablet Take 1 tablet (30 mg total) by mouth daily. 07/09/21   Nanetta Batty  J, MD  Multiple Vitamins-Minerals (MULTIVITAMIN WITH MINERALS) tablet Take 1 tablet by mouth daily. men    [provider]  NAPROXEN PO Take 220 mg by mouth daily as needed (Headache).    [provider]  Omega-3 Fatty Acids (OMEGA-3 PO) Take 3,600 mg by mouth daily.    [provider]    Family History    No family history on file. has no family status  information on file.   Social History    Social History   Socioeconomic History   Marital status: Married    Spouse name: Not on file   Number of children: Not on file   Years of education: Not on file   Highest education level: Not on file  Occupational History   Not on file  Tobacco Use   Smoking status: Every Day    Current packs/day: 1.00    Average packs/day: 1 pack/day for 49.0 years (49.0 ttl pk-yrs)    Types: Cigarettes   Smokeless tobacco: Never  Substance and Sexual Activity   Alcohol use: Not on file   Drug use: Not on file   Sexual activity: Not on file  Other Topics Concern   Not on file  Social History Narrative   Not on file   Social Determinants of Health   Financial Resource Strain: Low Risk  (07/05/2022)   Overall Financial Resource Strain (CARDIA)    Difficulty of Paying Living Expenses: Not very hard  Food Insecurity: No Food Insecurity (07/05/2022)   Hunger Vital Sign    Worried About Running Out of Food in the Last Year: Never true    Ran Out of Food in the Last Year: Never true  Transportation Needs: No Transportation Needs (07/05/2022)   PRAPARE - Administrator, Civil Service (Medical): No    Lack of Transportation (Non-Medical): No  Physical Activity: Not on file  Stress: Not on file  Social Connections: Unknown (09/20/2021)   Received from [facility]   Social Network    Social Network: Not on file  Intimate Partner Violence: Unknown (08/12/2021)   Received from [facility]   HITS    Physically Hurt: Not on file    Insult or Talk Down To: Not on file    Threaten Physical Harm: Not on file    Scream or Curse: Not on file     Review of Systems    General:  No chills, fever, night sweats or weight changes.  Cardiovascular:  No chest pain, dyspnea on exertion, edema, orthopnea, palpitations, paroxysmal nocturnal dyspnea. Dermatological: No rash, lesions/masses Respiratory: No cough,  dyspnea Urologic: No hematuria, dysuria Abdominal:   No nausea, vomiting, diarrhea, bright red blood per rectum, melena, or hematemesis Neurologic:  No visual changes, wkns, changes in mental status. All other systems reviewed and are otherwise negative except as noted above.  Physical Exam    VS:  There were no vitals taken for this visit. , BMI There is no height or weight on file to calculate BMI. GEN: Well nourished, well developed, in no acute distress. HEENT: normal. Neck: Supple, no JVD, carotid bruits, or masses. Cardiac: RRR, no murmurs, rubs, or gallops. No clubbing, cyanosis, edema.  Radials/DP/PT 2+ and equal bilaterally.  Respiratory:  Respirations regular and unlabored, clear to auscultation bilaterally. GI: Soft, nontender, nondistended, BS + x 4. MS: no deformity or atrophy. Skin: warm and dry, no rash. Neuro:  Strength and sensation are intact. Psych: Normal affect.  Accessory Clinical Findings    Recent Labs: 07/04/2022: ALT 58   Recent Lipid Panel    Component Value Date/Time   CHOL 172 07/04/2022 0825   TRIG 80 07/04/2022 0825   HDL 83 07/04/2022 0825   CHOLHDL 2.1 07/04/2022 0825   LDLCALC 74 07/04/2022 0825    No BP recorded.  {Refresh Note OR Click here to enter BP  :1}***    ECG personally reviewed by me today-none today.  Echocardiogram 12/16/2020  IMPRESSIONS     1. Left ventricular ejection fraction by 3D volume is 65 %. The left  ventricle has normal function. The left ventricle has no regional wall  motion abnormalities. Left ventricular diastolic parameters were normal.   2. Right ventricular systolic function is normal. The right ventricular  size is normal. Tricuspid regurgitation signal is inadequate for assessing  PA pressure.   3. The mitral valve is normal in structure. No evidence of mitral valve  regurgitation. No evidence of mitral stenosis.   4. The aortic valve is grossly normal. Aortic valve regurgitation is  trivial. No  aortic stenosis is present.   5. The inferior vena cava is normal in size with greater than 50%  respiratory variability, suggesting right atrial pressure of 3 mmHg.   Cardiac catheterization 12/28/2020   Prox RCA lesion is 100% stenosed.   Ost Cx to Prox Cx lesion is 30% stenosed.   1st Diag lesion is 80% stenosed.   Larry Ashley is a 62 y.o. male      [id number] LOCATION:  FACILITY: [facility]  PHYSICIAN: Nanetta Batty, M.D. 1961-04-20     DATE OF PROCEDURE:  12/28/2020  Diagnostic Dominance: Right  Intervention   Assessment & Plan   1.  Coronary artery disease-no chest pain today.  Underwent cardiac catheterization 12/28/2020 which showed occluded RCA with collaterals left to right and ostial diagonal branch stenosis.  He was placed on long-acting nitrate therapy and statin therapy. Continue Imdur, atorvastatin, omega-3 fatty acids Heart healthy low-sodium diet-salty 6 given Increase physical activity as tolerated  Hyperlipidemia-LDL 75 on 07/02/21 Continue atorvastatin, omega-3 fatty acids Heart healthy low-sodium high-fiber diet Increase physical activity as tolerated Start fiber supplement and increase fiber in diet  Tobacco abuse-continues to refrain from smoking but is vaping.  Discussed importance of decreasing vaping and quitting altogether. Congratulated on smoking cessation  Disposition: Follow-up with Dr.Berry or me in 6-9 months.   Thomasene Ripple. Taeja Debellis NP-C     03/21/2023, 4:58 PM [facility] [address] Office [phone] Fax [phone]  Notice: This dictation was prepared with Dragon dictation along with smaller phrase technology. Any transcriptional errors that result from this process are unintentional and may not be corrected upon review.  I spent 14*** minutes examining this patient, reviewing medications, and using patient centered shared decision making involving her cardiac care.  Prior to her visit I spent  greater than 20 minutes reviewing her past medical history,  medications, and prior cardiac tests.

## 2023-03-24 ENCOUNTER — Ambulatory Visit: Payer: Commercial Managed Care - PPO | Admitting: General Practice

## 2023-04-23 ENCOUNTER — Other Ambulatory Visit: Payer: Self-pay | Admitting: Cardiovascular Disease

## 2023-04-23 DIAGNOSIS — E7800 Pure hypercholesterolemia, unspecified: Secondary | ICD-10-CM

## 2023-04-24 NOTE — Progress Notes (Unsigned)
Cardiology Clinic Note   Patient Name: Larry Ashley Date of Encounter: 04/27/2023  Primary Care Provider:  Clovis Riley, L.August Saucer, MD (Inactive) Primary Cardiologist:  Nanetta Batty, MD  Patient Profile    Larry Ashley 62 year old male presents to the clinic today for follow-up evaluation of his coronary artery disease and hyperlipidemia.  Past Medical History    Past Medical History:  Diagnosis Date   Coronary artery disease    IBS (irritable bowel syndrome)    Past Surgical History:  Procedure Laterality Date   LEFT HEART CATH AND CORONARY ANGIOGRAPHY N/A 12/28/2020   Procedure: LEFT HEART CATH AND CORONARY ANGIOGRAPHY;  Surgeon: Runell Gess, MD;  Location: [facility];  Service: Cardiovascular;  Laterality: N/A;    Allergies  No Known Allergies  History of Present Illness    Larry Ashley has a PMH of coronary artery disease, chest pain, family history of heart disease, hyperlipidemia, and tobacco abuse.  He was initially seen by cardiology after he presented to the emergency department with chest discomfort.  He was seen 01/13/2021.  He reported having developed chest pain over the past 6-8 months.  His discomfort would occur once or twice per week and last for up to 30 minutes at a time.  His echocardiogram was normal and his coronary CTA 12/09/2020 showed an occluded RCA.  He and Dr.Berry decided to proceed with coronary angiography.  Smoking cessation was encouraged.  He underwent cardiac catheterization on 12/28/2020.  That showed occluded RCA with left-to-right collaterals and ostial diagonal branch stenosis.  His chest discomfort significantly improved with long-acting nitrate therapy.  He was also placed on statin therapy.  He was seen in follow-up by Dr. Allyson Sabal on 07/09/2021.  During that time he reported that he had started smoking again.  He had an episode of chest discomfort that led to an emergency room visit.  He left before being evaluated.  He had  cardiac enzymes drawn which were low.  He stopped smoking 06/11/2021.  He did not have recurrent symptoms.  His cholesterol improved with statin therapy. His Imdur was increased from 51m g to 30 mg.  He presented to the clinic 01/05/22 for follow-up evaluation stated initially he did not tolerate increased Imdur well.  He stopped drinking caffeine and then increase to 30 mg of Imdur daily.  He was able to tolerate the medication after stopping caffeine.  He did continue to note occasional episodes of chest tightness.  He reported increased stress at work.  He was now a Neurosurgeon doing both jobs.  After working he was then checking emails.  Reported walking frequently and working 8 to 10-hour days.  We reviewed his cardiac catheterization and he expressed understanding.  We reviewed his  cholesterol panel.  l continued his current medication regimen, gave mindfulness stress reduction, asked him to increase the fiber in his diet, and planned follow-up for 6 months.  He was seen in follow-up by Dr. Allyson Sabal 07/05/2022.  He remained stable from a cardiac standpoint.  He had lost about 16 pounds intentionally with improved diet.  He continued smoking cessation but was vaping.  He denied chest pain and shortness of breath.  He continued to consume about a gallon of alcohol per week and wished to stop consuming EtOH.  His EKG showed sinus rhythm 95 bpm with small inferior Q waves and poor R wave progression.  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  His blood pressure today is 94/70.  He reports that he has not been drinking any alcohol since April.  He does drink an increased amount of coffee.  I encouraged him to increase his hydration.  We reviewed his cholesterol.  His LDL cholesterol was noted to be 106 on 10/04/2022.  He is fasting today and I will recheck his fasting lipids.  We reviewed his previous echocardiogram  and cardiac catheterization.  He expressed understanding.  Will plan follow-up in 6 months.  I will also give him the smoking cessation information and to refill his cardiac medications with 90-day supplies.  Today he denies chest pain, shortness of breath, lower extremity edema, fatigue, palpitations, melena, hematuria, hemoptysis, diaphoresis, weakness, presyncope, syncope, orthopnea, and PND.      Home Medications    Prior to Admission medications   Medication Sig Start Date End Date Taking? Authorizing Provider  atorvastatin (LIPITOR) 40 MG tablet Take 1 tablet (40 mg total) by mouth daily. 11/26/21   Runell Gess, MD  isosorbide mononitrate (IMDUR) 30 MG 24 hr tablet Take 1 tablet (30 mg total) by mouth daily. 07/09/21   Runell Gess, MD  Multiple Vitamins-Minerals (MULTIVITAMIN WITH MINERALS) tablet Take 1 tablet by mouth daily. men    [provider]  NAPROXEN PO Take 220 mg by mouth daily as needed (Headache).    [provider]  Omega-3 Fatty Acids (OMEGA-3 PO) Take 3,600 mg by mouth daily.    [provider]    Family History    No family history on file. has no family status information on file.   Social History    Social History   Socioeconomic History   Marital status: Married    Spouse name: Not on file   Number of children: Not on file   Years of education: Not on file   Highest education level: Not on file  Occupational History   Not on file  Tobacco Use   Smoking status: Every Day    Current packs/day: 1.00    Average packs/day: 1 pack/day for 49.0 years (49.0 ttl pk-yrs)    Types: Cigarettes   Smokeless tobacco: Never  Substance and Sexual Activity   Alcohol use: Not on file   Drug use: Not on file   Sexual activity: Not on file  Other Topics Concern   Not on file  Social History Narrative   Not on file   Social Drivers of Health   Financial Resource Strain: Low Risk  (07/05/2022)   Overall Financial Resource  Strain (CARDIA)    Difficulty of Paying Living Expenses: Not very hard  Food Insecurity: No Food Insecurity (07/05/2022)   Hunger Vital Sign    Worried About Running Out of Food in the Last Year: Never true    Ran Out of Food in the Last Year: Never true  Transportation Needs: No Transportation Needs (07/05/2022)   PRAPARE - Administrator, Civil Service (Medical): No    Lack of Transportation (Non-Medical): No  Physical Activity: Not on file  Stress: Not on file  Social Connections: Unknown (09/20/2021)   Received from [facility], [facility]   Social Network    Social Network: Not on file  Intimate Partner Violence: Unknown (08/12/2021)   Received from [facility], [facility]   HITS    Physically Hurt: Not on file    Insult or Talk Down To: Not on file  Threaten Physical Harm: Not on file    Scream or Curse: Not on file     Review of Systems    General:  No chills, fever, night sweats or weight changes.  Cardiovascular:  No chest pain, dyspnea on exertion, edema, orthopnea, palpitations, paroxysmal nocturnal dyspnea. Dermatological: No rash, lesions/masses Respiratory: No cough, dyspnea Urologic: No hematuria, dysuria Abdominal:   No nausea, vomiting, diarrhea, bright red blood per rectum, melena, or hematemesis Neurologic:  No visual changes, wkns, changes in mental status. All other systems reviewed and are otherwise negative except as noted above.  Physical Exam    VS:  BP 94/70 (BP Location: Left Arm, Patient Position: Sitting, Cuff Size: Normal)   Pulse 88   Ht 5\' 2"  (1.575 m)   Wt 152 lb 12.8 oz (69.3 kg)   SpO2 93%   BMI 27.95 kg/m  , BMI Body mass index is 27.95 kg/m. GEN: Well nourished, well developed, in no acute distress. HEENT: normal. Neck: Supple, no JVD, carotid bruits, or masses. Cardiac: RRR, no murmurs, rubs, or gallops. No clubbing, cyanosis, edema.  Radials/DP/PT 2+ and equal bilaterally.  Respiratory:  Respirations regular  and unlabored, clear to auscultation bilaterally. GI: Soft, nontender, nondistended, BS + x 4. MS: no deformity or atrophy. Skin: warm and dry, no rash. Neuro:  Strength and sensation are intact. Psych: Normal affect.  Accessory Clinical Findings    Recent Labs: 07/04/2022: ALT 58   Recent Lipid Panel    Component Value Date/Time   CHOL 172 07/04/2022 0825   TRIG 80 07/04/2022 0825   HDL 83 07/04/2022 0825   CHOLHDL 2.1 07/04/2022 0825   LDLCALC 74 07/04/2022 0825         ECG personally reviewed by me today-EKG Interpretation Date/Time:  Thursday April 27 2023 09:11:59 EST Ventricular Rate:  88 PR Interval:  140 QRS Duration:  74 QT Interval:  336 QTC Calculation: 406 R Axis:   -31  Text Interpretation: Normal sinus rhythm Left axis deviation Anterior infarct , age undetermined Confirmed by Edd Fabian [phone]) on 04/27/2023 9:17:24 AM   Echocardiogram 12/16/2020  IMPRESSIONS     1. Left ventricular ejection fraction by 3D volume is 65 %. The left  ventricle has normal function. The left ventricle has no regional wall  motion abnormalities. Left ventricular diastolic parameters were normal.   2. Right ventricular systolic function is normal. The right ventricular  size is normal. Tricuspid regurgitation signal is inadequate for assessing  PA pressure.   3. The mitral valve is normal in structure. No evidence of mitral valve  regurgitation. No evidence of mitral stenosis.   4. The aortic valve is grossly normal. Aortic valve regurgitation is  trivial. No aortic stenosis is present.   5. The inferior vena cava is normal in size with greater than 50%  respiratory variability, suggesting right atrial pressure of 3 mmHg.   Cardiac catheterization 12/28/2020   Prox RCA lesion is 100% stenosed.   Ost Cx to Prox Cx lesion is 30% stenosed.   1st Diag lesion is 80% stenosed.   Larry Ashley is a 62 y.o. male      [id number] LOCATION:  FACILITY: [facility]  PHYSICIAN:  Nanetta Batty, M.D. 1960-07-04     DATE OF PROCEDURE:  12/28/2020  Diagnostic Dominance: Right  Intervention   Assessment & Plan   1.  Coronary artery disease-denies anginal symptoms.  With cardiac catheterization 12/28/2020 which showed occluded RCA with collaterals left to right and ostial diagonal branch  stenosis.  He was placed on long-acting nitrate therapy and statin therapy. Continue current medical therapy Heart healthy low-sodium diet-salty 6 reviewed Maintain physical activity  Hyperlipidemia-LDL 106 on 10/04/22.   Continue atorvastatin, omega-3 fatty acids High-fiber diet Continue fiber supplement Lipids and lfts   Tobacco abuse-smoking half pack per day.  Complete cessation strongly encouraged. Congratulated on smoking cessation  Disposition: Follow-up with Dr.Berry or me in 6 months.   Thomasene Ripple. Annalie Wenner NP-C     04/27/2023, 9:44 AM [facility] [address] Office [phone] Fax [phone]  Notice: This dictation was prepared with Dragon dictation along with smaller phrase technology. Any transcriptional errors that result from this process are unintentional and may not be corrected upon review.  I spent 14 minutes examining this patient, reviewing medications, and using patient centered shared decision making involving her cardiac care.  Prior to her visit I spent greater than 20 minutes reviewing her past medical history,  medications, and prior cardiac tests.

## 2023-04-27 ENCOUNTER — Encounter: Payer: Self-pay | Admitting: General Practice

## 2023-04-27 ENCOUNTER — Ambulatory Visit: Payer: Commercial Managed Care - PPO | Attending: General Practice | Admitting: General Practice

## 2023-04-27 VITALS — BP 94/70 | HR 88 | Ht 62.0 in | Wt 152.8 lb

## 2023-04-27 DIAGNOSIS — E7800 Pure hypercholesterolemia, unspecified: Secondary | ICD-10-CM | POA: Diagnosis not present

## 2023-04-27 DIAGNOSIS — I2510 Atherosclerotic heart disease of native coronary artery without angina pectoris: Secondary | ICD-10-CM

## 2023-04-27 DIAGNOSIS — E782 Mixed hyperlipidemia: Secondary | ICD-10-CM

## 2023-04-27 DIAGNOSIS — Z720 Tobacco use: Secondary | ICD-10-CM

## 2023-04-27 LAB — LIPID PANEL
Chol/HDL Ratio: 3.3 {ratio} (ref 0.0–5.0)
Cholesterol, Total: 129 mg/dL (ref 100–199)
HDL: 39 mg/dL — ABNORMAL LOW (ref >39)
LDL Chol Calc (NIH): 75 mg/dL (ref 0–99)
Triglycerides: 76 mg/dL (ref 0–149)
VLDL Cholesterol Cal: 15 mg/dL (ref 5–40)

## 2023-04-27 LAB — HEPATIC FUNCTION PANEL
ALT: 25 [IU]/L (ref 0–44)
AST: 28 [IU]/L (ref 0–40)
Albumin: 4.5 g/dL (ref 3.8–4.9)
Alkaline Phosphatase: 133 [IU]/L — ABNORMAL HIGH (ref 44–121)
Bilirubin Total: 0.5 mg/dL (ref 0.0–1.2)
Bilirubin, Direct: 0.17 mg/dL (ref 0.00–0.40)
Total Protein: 7 g/dL (ref 6.0–8.5)

## 2023-04-27 MED ORDER — ATORVASTATIN CALCIUM 40 MG PO TABS: 40.0000 mg | ORAL_TABLET | Freq: Every day | ORAL | 0 refills | Status: DC

## 2023-04-27 MED ORDER — ISOSORBIDE MONONITRATE ER 30 MG PO TB24: 30.0000 mg | ORAL_TABLET | Freq: Every day | ORAL | 3 refills | Status: DC

## 2023-04-27 NOTE — Patient Instructions (Signed)
Medication Instructions:  START FIBER SUPPLEMENT-BENEFIBER, METAMUCIL, ETC... The current medical regimen is effective;  continue present plan and medications as directed. Please refer to the Current Medication list given to you today.  *If you need a refill on your cardiac medications before your next appointment, please call your pharmacy*  Lab Work: FASTING LIPID AND LFT TODAY If you have labs (blood work) drawn today and your tests are completely normal, you will receive your results only by:  MyChart Message (if you have MyChart) OR A paper copy in the mail If you have any lab test that is abnormal or we need to change your treatment, we will call you to review the results.  Follow-Up: At [facility], you and your health needs are our priority.  As part of our continuing mission to provide you with exceptional heart care, we have created designated Provider Care Teams.  These Care Teams include your primary Cardiologist (physician) and Advanced Practice Providers (APPs -  Physician Assistants and Nurse Practitioners) who all work together to provide you with the care you need, when you need it.  Your next appointment:   6 month(s)  Provider:   Nanetta Batty, MD     Other Instructions MAINTAIN PHYSICAL ACTIVITY Steps to Quit Smoking Smoking tobacco is the leading cause of preventable death. It can affect almost every organ in the body. Smoking puts you and people around you at risk for many serious, long-lasting (chronic) diseases. Quitting smoking can be hard, but it is one of the best things that you can do for your health. It is never too late to quit. Do not give up if you cannot quit the first time. Some people need to try many times to quit. Do your best to stick to your quit plan, and talk with your doctor if you have any questions or concerns. How do I get ready to quit? Pick a date to quit. Set a date within the next 2 weeks to give you time to prepare. Write down  the reasons why you are quitting. Keep this list in places where you will see it often. Tell your family, friends, and co-workers that you are quitting. Their support is important. Talk with your doctor about the choices that may help you quit. Find out if your health insurance will pay for these treatments. Know the people, places, things, and activities that make you want to smoke (triggers). Avoid them. What first steps can I take to quit smoking? Throw away all cigarettes at home, at work, and in your car. Throw away the things that you use when you smoke, such as ashtrays and lighters. Clean your car. Empty the ashtray. Clean your home, including curtains and carpets. What can I do to help me quit smoking? Talk with your doctor about taking medicines and seeing a counselor. You are more likely to succeed when you do both. If you are pregnant or breastfeeding: Talk with your doctor about counseling or other ways to quit smoking. Do not take medicine to help you quit smoking unless your doctor tells you to. Quit right away Quit smoking completely, instead of slowly cutting back on how much you smoke over a period of time. Stopping smoking right away may be more successful than slowly quitting. Go to counseling. In-person is best if this is an option. You are more likely to quit if you go to counseling sessions regularly. Take medicine You may take medicines to help you quit. Some medicines need a  prescription, and some you can buy over-the-counter. Some medicines may contain a drug called nicotine to replace the nicotine in cigarettes. Medicines may: Help you stop having the desire to smoke (cravings). Help to stop the problems that come when you stop smoking (withdrawal symptoms). Your doctor may ask you to use: Nicotine patches, gum, or lozenges. Nicotine inhalers or sprays. Non-nicotine medicine that you take by mouth. Find resources Find resources and other ways to help you quit  smoking and remain smoke-free after you quit. They include: Online chats with a Veterinary surgeon. Phone quitlines. Printed Materials engineer. Support groups or group counseling. Text messaging programs. Mobile phone apps. Use apps on your mobile phone or tablet that can help you stick to your quit plan. Examples of free services include Quit Guide from the CDC and smokefree.gov  What can I do to make it easier to quit?  Talk to your family and friends. Ask them to support and encourage you. Call a phone quitline, such as 1-800-QUIT-NOW, reach out to support groups, or work with a Veterinary surgeon. Ask people who smoke to not smoke around you. Avoid places that make you want to smoke, such as: Bars. Parties. Smoke-break areas at work. Spend time with people who do not smoke. Lower the stress in your life. Stress can make you want to smoke. Try these things to lower stress: Getting regular exercise. Doing deep-breathing exercises. Doing yoga. Meditating. What benefits will I see if I quit smoking? Over time, you may have: A better sense of smell and taste. Less coughing and sore throat. A slower heart rate. Lower blood pressure. Clearer skin. Better breathing. Fewer sick days. Summary Quitting smoking can be hard, but it is one of the best things that you can do for your health. Do not give up if you cannot quit the first time. Some people need to try many times to quit. When you decide to quit smoking, make a plan to help you succeed. Quit smoking right away, not slowly over a period of time. When you start quitting, get help and support to keep you smoke-free. This information is not intended to replace advice given to you by your health care provider. Make sure you discuss any questions you have with your health care provider. Document Revised: 04/16/2021 Document Reviewed: 04/16/2021 Elsevier Patient Education  2024 [employer].

## 2023-10-21 ENCOUNTER — Other Ambulatory Visit: Payer: Self-pay | Admitting: General Practice

## 2023-10-21 DIAGNOSIS — E7800 Pure hypercholesterolemia, unspecified: Secondary | ICD-10-CM

## 2023-10-24 ENCOUNTER — Ambulatory Visit: Payer: Commercial Managed Care - PPO | Attending: Cardiovascular Disease | Admitting: Cardiovascular Disease

## 2023-10-24 ENCOUNTER — Encounter: Payer: Self-pay | Admitting: Cardiovascular Disease

## 2023-10-24 VITALS — BP 124/80 | HR 71 | Ht 62.0 in | Wt 155.0 lb

## 2023-10-24 DIAGNOSIS — I2510 Atherosclerotic heart disease of native coronary artery without angina pectoris: Secondary | ICD-10-CM

## 2023-10-24 DIAGNOSIS — E782 Mixed hyperlipidemia: Secondary | ICD-10-CM

## 2023-10-24 DIAGNOSIS — E7800 Pure hypercholesterolemia, unspecified: Secondary | ICD-10-CM | POA: Diagnosis not present

## 2023-10-24 DIAGNOSIS — Z720 Tobacco use: Secondary | ICD-10-CM

## 2023-10-24 MED ORDER — ISOSORBIDE MONONITRATE ER 30 MG PO TB24: 30.0000 mg | ORAL_TABLET | Freq: Every day | ORAL | 3 refills | Status: AC

## 2023-10-24 MED ORDER — ATORVASTATIN CALCIUM 40 MG PO TABS: 40.0000 mg | ORAL_TABLET | Freq: Every day | ORAL | 3 refills | Status: DC

## 2023-10-24 NOTE — Assessment & Plan Note (Signed)
 History of hyperlipidemia on statin therapy with recent lipid profile performed 6//25 revealing total cholesterol 111, LDL 54 HDL 36.

## 2023-10-24 NOTE — Assessment & Plan Note (Signed)
 History of ongoing tobacco use of 5 packs/week.  He currently is taking nicotine replacement medication with the intent to hopefully stop.

## 2023-10-24 NOTE — Patient Instructions (Signed)

## 2023-10-24 NOTE — Assessment & Plan Note (Signed)
 History of CAD status post cardiac catheterization by myself after a coronary CTA 12/26/2020 suggested an occluded RCA.  I cathed him on 12/28/2021 revealing occluded dominant RCA with left-to-right collaterals and ostial diagonal branch stenosis.  He is on long-acting oral nitrate.  He is currently pain-free.

## 2023-10-24 NOTE — Progress Notes (Signed)
 10/24/2023 Larry Ashley   [birth year]  [id number]  Primary Physician Arlys Berke, MD Primary Cardiologist: Avanell Leigh MD Bennye Bravo, [place]  HPI:  Larry Ashley is a 63 y.o.   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 He was referred by the emergency room because of chest pain.  I last saw him in the office 07/10/2018.    His risk factors include 75 pack years of tobacco abuse, strong family history of heart disease with both mother and father who have had CAD in both who are patients of mine.  He is never had a heart attack or stroke.  He is developed chest pain over the last 6 to 8 months occurring once or twice a week lasting up to 30 minutes at a time.  He was recently seen in the ER 11/17/2020 with chest pain.  He ruled out for myocardial infarction.  His EKG showed no acute changes.  I performed 2D echocardiography which was normal and coronary CTA on 12/09/2020 that suggested an occluded RCA.  He has had several episodes of chest pain since I initially saw him.  Based on this, we decided to proceed with diagnostic coronary angiography.  We also discussed smoking cessation.   I performed cardiac catheterization on him 12/28/2020 revealing an occluded dominant RCA with left-to-right collaterals and ostial diagonal branch stenosis.  His chest pain significantly improved with the addition of low-dose long-acting oral nitrate.  I also began him on a statin drug.     Since I saw him in the office a year ago he continues to do well.  He went to alcohol rehab and has since stopped drinking.  He feels clinically improved.  He still smokes 5 packs of cigarettes a week but is hopeful things that he will stop as well.  He is fairly active and is completely asymptomatic.  Current Meds  Medication Sig   aspirin  EC 81 MG tablet Take 1 tablet (81 mg  total) by mouth daily. Swallow whole.   atorvastatin  (LIPITOR ) 40 MG tablet Take 1 tablet (40 mg total) by mouth daily.   folic acid (FOLVITE) 1 MG tablet Take 1 mg by mouth daily.   isosorbide  mononitrate (IMDUR ) 30 MG 24 hr tablet Take 1 tablet (30 mg total) by mouth daily.   Multiple Vitamins-Minerals (MULTIVITAMIN WITH MINERALS) tablet Take 1 tablet by mouth daily. men   nicotine polacrilex (COMMIT) 4 MG lozenge Take 4 mg by mouth as needed for smoking cessation.   Omega-3 Fatty Acids (OMEGA-3 PO) Take 3,600 mg by mouth daily.   QUEtiapine (SEROQUEL) 50 MG tablet Take 50 mg by mouth at bedtime.   sertraline (ZOLOFT) 50 MG tablet Take 50 mg by mouth daily.   traZODone (DESYREL) 50 MG tablet Take 50 mg by mouth at bedtime.     No Known Allergies  Social History   Socioeconomic History   Marital status: Married    Spouse name: Not on file   Number of children: Not on file   Years of education: Not on file   Highest education level: Not on file  Occupational History   Not on file  Tobacco Use   Smoking status: Every Day    Current packs/day: 1.00    Average packs/day: 1 pack/day for 49.0 years (49.0 ttl pk-yrs)  Types: Cigarettes   Smokeless tobacco: Never  Substance and Sexual Activity   Alcohol use: Not on file   Drug use: Not on file   Sexual activity: Not on file  Other Topics Concern   Not on file  Social History Narrative   Not on file   Social Drivers of Health   Financial Resource Strain: Low Risk  (07/05/2022)   Overall Financial Resource Strain (CARDIA)    Difficulty of Paying Living Expenses: Not very hard  Food Insecurity: No Food Insecurity (07/05/2022)   Hunger Vital Sign    Worried About Running Out of Food in the Last Year: Never true    Ran Out of Food in the Last Year: Never true  Transportation Needs: No Transportation Needs (07/05/2022)   PRAPARE - Administrator, Civil Service (Medical): No    Lack of Transportation (Non-Medical): No   Physical Activity: Not on file  Stress: Not on file  Social Connections: Unknown (09/20/2021)   [facility]   Social Network    Social Network: Not on file  Intimate Partner Violence: Unknown (08/12/2021)   Received from [facility]   HITS    Physically Hurt: Not on file    Insult or Talk Down To: Not on file    Threaten Physical Harm: Not on file    Scream or Curse: Not on file     Review of Systems: General: negative for chills, fever, night sweats or weight changes.  Cardiovascular: negative for chest pain, dyspnea on exertion, edema, orthopnea, palpitations, paroxysmal nocturnal dyspnea or shortness of breath Dermatological: negative for rash Respiratory: negative for cough or wheezing Urologic: negative for hematuria Abdominal: negative for nausea, vomiting, diarrhea, bright red blood per rectum, melena, or hematemesis Neurologic: negative for visual changes, syncope, or dizziness All other systems reviewed and are otherwise negative except as noted above.    Blood pressure 124/80, pulse 71, height 5' 2 (1.575 m), weight 155 lb (70.3 kg), SpO2 93%.  General appearance: alert and no distress Neck: no adenopathy, no carotid bruit, no JVD, supple, symmetrical, trachea midline, and thyroid not enlarged, symmetric, no tenderness/mass/nodules Lungs: clear to auscultation bilaterally Heart: regular rate and rhythm, S1, S2 normal, no murmur, click, rub or gallop Extremities: extremities normal, atraumatic, no cyanosis or edema Pulses: 2+ and symmetric Skin: Skin color, texture, turgor normal. No rashes or lesions Neurologic: Grossly normal  EKG EKG Interpretation Date/Time:  Tuesday October 24 2023 08:08:33 EDT Ventricular Rate:  71 PR Interval:  164 QRS Duration:  76 QT Interval:  364 QTC Calculation: 395 R Axis:   -15  Text Interpretation: Normal sinus rhythm Normal ECG When compared with ECG of 27-Apr-2023 09:11, No significant change was found Confirmed  by Lauro Portal [phone]) on 10/24/2023 8:10:45 AM    ASSESSMENT AND PLAN:   Tobacco abuse History of ongoing tobacco use of 5 packs/week.  He currently is taking nicotine replacement medication with the intent to hopefully stop.  Hyperlipidemia History of hyperlipidemia on statin therapy with recent lipid profile performed 6//25 revealing total cholesterol 111, LDL 54 HDL 36.  Coronary artery disease History of CAD status post cardiac catheterization by myself after a coronary CTA 12/26/2020 suggested an occluded RCA.  I cathed him on 12/28/2021 revealing occluded dominant RCA with left-to-right collaterals and ostial diagonal branch stenosis.  He is on long-acting oral nitrate.  He is currently pain-free.     Avanell Leigh MD FACP,FACC,FAHA, [facility] 10/24/2023 8:22 AM

## 2024-02-10 ENCOUNTER — Other Ambulatory Visit: Payer: Self-pay | Admitting: General Practice

## 2024-02-10 DIAGNOSIS — E7800 Pure hypercholesterolemia, unspecified: Secondary | ICD-10-CM

## 2024-02-20 ENCOUNTER — Other Ambulatory Visit: Payer: Self-pay | Admitting: Cardiovascular Disease

## 2024-02-20 DIAGNOSIS — E7800 Pure hypercholesterolemia, unspecified: Secondary | ICD-10-CM

## 2024-02-21 ENCOUNTER — Telehealth: Payer: Self-pay | Admitting: Cardiovascular Disease

## 2024-02-21 DIAGNOSIS — E7800 Pure hypercholesterolemia, unspecified: Secondary | ICD-10-CM

## 2024-02-21 MED ORDER — ATORVASTATIN CALCIUM 40 MG PO TABS: 40.0000 mg | ORAL_TABLET | Freq: Every day | ORAL | 2 refills | Status: AC

## 2024-02-21 NOTE — Telephone Encounter (Signed)
*  STAT* If patient is at the pharmacy, call can be transferred to refill team.   1. Which medications need to be refilled? (please list name of each medication and dose if known)   atorvastatin  (LIPITOR) 40 MG tablet    2. Which pharmacy/location (including street and city if local pharmacy) is medication to be sent to?  [id number] - [place] ([place]), [place] - [address]      3. Do they need a 30 day or 90 day supply? 90 day    Pt is out of medication

## 2024-02-21 NOTE — Telephone Encounter (Signed)
 Pt's medication was sent to pt's pharmacy as requested. Confirmation received.

## 2024-03-04 ENCOUNTER — Encounter: Payer: Self-pay | Admitting: Cardiovascular Disease
